# Patient Record
Sex: Male | Born: 1980
Health system: Southern US, Community
[De-identification: ages and names within clinical notes are randomized; demographics above are authoritative.]

## PROBLEM LIST (undated history)

## (undated) DIAGNOSIS — K222 Esophageal obstruction: Secondary | ICD-10-CM

## (undated) DIAGNOSIS — Z87442 Personal history of urinary calculi: Secondary | ICD-10-CM

## (undated) DIAGNOSIS — K529 Noninfective gastroenteritis and colitis, unspecified: Secondary | ICD-10-CM

## (undated) DIAGNOSIS — K219 Gastro-esophageal reflux disease without esophagitis: Secondary | ICD-10-CM

## (undated) DIAGNOSIS — F17200 Nicotine dependence, unspecified, uncomplicated: Secondary | ICD-10-CM

## (undated) DIAGNOSIS — K2970 Gastritis, unspecified, without bleeding: Secondary | ICD-10-CM

## (undated) DIAGNOSIS — R609 Edema, unspecified: Secondary | ICD-10-CM

## (undated) DIAGNOSIS — Z8619 Personal history of other infectious and parasitic diseases: Secondary | ICD-10-CM

## (undated) DIAGNOSIS — M545 Low back pain: Secondary | ICD-10-CM

## (undated) DIAGNOSIS — E669 Obesity, unspecified: Secondary | ICD-10-CM

## (undated) DIAGNOSIS — R12 Heartburn: Secondary | ICD-10-CM

## (undated) HISTORY — DX: Gastro-esophageal reflux disease without esophagitis: K21.9

## (undated) HISTORY — DX: Heartburn: R12

## (undated) HISTORY — DX: Personal history of other infectious and parasitic diseases: Z86.19

## (undated) HISTORY — DX: Edema, unspecified: R60.9

## (undated) HISTORY — DX: Esophageal obstruction: K22.2

## (undated) HISTORY — DX: Obesity, unspecified: E66.9

## (undated) HISTORY — DX: Nicotine dependence, unspecified, uncomplicated: F17.200

## (undated) HISTORY — DX: Low back pain: M54.5

## (undated) HISTORY — DX: Personal history of urinary calculi: Z87.442

## (undated) HISTORY — DX: Gastritis, unspecified, without bleeding: K29.70

## (undated) HISTORY — PX: ELBOW FRACTURE SURGERY: SHX616

## (undated) HISTORY — PX: WISDOM TOOTH EXTRACTION: SHX21

## (undated) HISTORY — DX: Noninfective gastroenteritis and colitis, unspecified: K52.9

---
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
## 2012-07-02 ENCOUNTER — Ambulatory Visit (INDEPENDENT_AMBULATORY_CARE_PROVIDER_SITE_OTHER): Payer: 59 | Admitting: Family Medicine

## 2012-07-02 ENCOUNTER — Encounter: Payer: Self-pay | Admitting: Family Medicine

## 2012-07-02 VITALS — BP 122/82 | HR 82 | Temp 98.3°F | Ht 73.0 in | Wt 301.2 lb

## 2012-07-02 DIAGNOSIS — K219 Gastro-esophageal reflux disease without esophagitis: Secondary | ICD-10-CM

## 2012-07-02 DIAGNOSIS — F17200 Nicotine dependence, unspecified, uncomplicated: Secondary | ICD-10-CM

## 2012-07-02 DIAGNOSIS — Z23 Encounter for immunization: Secondary | ICD-10-CM

## 2012-07-02 DIAGNOSIS — E669 Obesity, unspecified: Secondary | ICD-10-CM

## 2012-07-02 LAB — LIPID PANEL
HDL: 38.7 mg/dL — ABNORMAL LOW (ref >39.00)
LDL Cholesterol: 139 mg/dL — ABNORMAL HIGH (ref 0–99)
Total CHOL/HDL Ratio: 5
Triglycerides: 104 mg/dL (ref 0.0–149.0)
VLDL: 20.8 mg/dL (ref 0.0–40.0)

## 2012-07-02 LAB — COMPREHENSIVE METABOLIC PANEL
ALT: 63 U/L — ABNORMAL HIGH (ref 0–53)
Alkaline Phosphatase: 51 U/L (ref 39–117)
CO2: 27 mEq/L (ref 19–32)
Creatinine, Ser: 0.8 mg/dL (ref 0.4–1.5)
GFR: 127.31 mL/min (ref >60.00)
Total Bilirubin: 0.4 mg/dL (ref 0.3–1.2)

## 2012-07-02 NOTE — Assessment & Plan Note (Signed)
Discussed importance of smoking cessation as well as different options available.  Encouraged smoking cessation.  Gauged progress on stages of change - motivated to make change.  Thinks may quit cold [place] as has done this in past.  Main concern if weight gain after quitting smoking.  See below.  Provided with quitline[place].com resource.

## 2012-07-02 NOTE — Assessment & Plan Note (Signed)
Body mass index is 39.75 kg/(m^2). Discussed weight as well as questionable safety of OTC supplements for weight loss. Encouraged focusing on healthy changes to affect weight change including regular exercise despite season change, and diet changes. Motivated for weight loss. rtc prn or 1-2 yrs for next CPE.

## 2012-07-02 NOTE — Assessment & Plan Note (Signed)
Stable on omeprazole 20mg  daily.  Will likely improve with weight loss.

## 2012-07-02 NOTE — Addendum Note (Signed)
Addended by: Josph Macho A on: 07/02/2012 11:01 AM   Modules accepted: Orders

## 2012-07-02 NOTE — Progress Notes (Signed)
Subjective:    Patient ID: Micheal Jacobs, male    DOB: 07/09/1980, 32 y.o.   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  Wife Art gallery manager - at home 1 mo then away 1 mo.  Has 13 mo, 31 yo, and 31 yo at home.  GERD - controlled on prilosec.  Smoking - 1/2 ppd for 15 yrs.  Quit in past and gained 120 lbs.  E cigs cause sinus infection.  Has never used NRT or chantix.  Interested in quitting.  Caffeine: none Lives with wife, 3 children.  2 dogs at home, 2 goldfish Mother is anesthesiologist.   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 Down 15 lbs in last 2 weeks Using lipo six - oxy elite pro.  Has caffeine and some thermogenic component, appetite suppressant.  Denies chest pain, palpitations, insomnia. Changing diet - cutting portions.  Vegetables daily.  Avoids fish 2/2 shellfish allergy.  Preventative: Flu shot - declines. Tetanus - unsure.  Tdap today.  Medications and allergies reviewed and updated in chart.  Past histories reviewed and updated if relevant as below. There is no problem list on file for this patient.  Past Medical History  Diagnosis Date  . History of chicken pox   . Colitis     at age 28 yo?  . GERD (gastroesophageal reflux disease)   . History of kidney stones   . Smoker    Past Surgical History  Procedure Laterality Date  . Tonsillectomy  1986  . Elbow fracture surgery  1990s  . Wisdom tooth extraction     History  Substance Use Topics  . Smoking status: Current Every Day Smoker -- 0.50 packs/day    Types: Cigarettes    Start date: 05/22/1998  . Smokeless tobacco: Never Used  . Alcohol Use: Yes     Comment: 2/monthly; summer-every weekend   Family History  Problem Relation Age of Onset  . Heart disease Father 64    CHF  . CAD Paternal  Grandfather 67    MI  . Diabetes Father   . Arthritis Father   . Hypertension Mother   . Cancer Neg Hx   . Stroke Neg Hx    No Known Allergies No current outpatient prescriptions on file prior to visit.   No current facility-administered medications on file prior to visit.     Review of Systems  Constitutional: Negative for fever, chills, activity change, appetite change, fatigue and unexpected weight change.  HENT: Negative for hearing loss and neck pain.   Eyes: Negative for visual disturbance.  Respiratory: Negative for cough, chest tightness, shortness of breath and wheezing.   Cardiovascular: Negative for chest pain, palpitations and leg swelling.  Gastrointestinal: Negative for nausea, vomiting, abdominal pain, diarrhea, constipation, blood in stool and abdominal distention.  Genitourinary: Negative for hematuria and difficulty urinating.  Musculoskeletal: Negative for myalgias and arthralgias.  Skin: Negative for rash.  Neurological: Negative for dizziness, seizures, syncope and headaches.  Hematological: Does not bruise/bleed easily.  Psychiatric/Behavioral: Negative for dysphoric mood. The patient is not nervous/anxious.        Objective:   Physical Exam  Nursing note and vitals reviewed. Constitutional: He is oriented to person, place, and time. He appears well-developed and well-nourished. No distress.  obese  HENT:  Head:  Normocephalic and atraumatic.  Right Ear: Hearing, tympanic membrane, external ear and ear canal normal.  Left Ear: Hearing, tympanic membrane, external ear and ear canal normal.  Nose: Nose normal.  Mouth/Throat: Oropharynx is clear and moist. No oropharyngeal exudate.  Eyes: Conjunctivae and EOM are normal. Pupils are equal, round, and reactive to light. No scleral icterus.  Neck: Normal range of motion. Neck supple.  Cardiovascular: Normal rate, regular rhythm, normal heart sounds and intact distal pulses.   No murmur heard. Pulses:       Radial pulses are 2+ on the right side, and 2+ on the left side.  Pulmonary/Chest: Effort normal and breath sounds normal. No respiratory distress. He has no wheezes. He has no rales.  Abdominal: Soft. Bowel sounds are normal. He exhibits no distension and no mass. There is no tenderness. There is no rebound and no guarding.  Musculoskeletal: Normal range of motion. He exhibits no edema.  Lymphadenopathy:    He has no cervical adenopathy.  Neurological: He is alert and oriented to person, place, and time.  CN grossly intact, station and gait intact  Skin: Skin is warm and dry. No rash noted.  Psychiatric: He has a normal mood and affect. His behavior is normal. Judgment and thought content normal.       Assessment & Plan:  Tdap today.

## 2012-07-02 NOTE — Patient Instructions (Addendum)
Good to meet you today. Return as needed or in 1-2 years for next physical. Blood work today. Work on [employer] and exercise. Work on quitting smoking Let me know if you would like assistance with smoking cessation or further discussion of weight. Look into quitline[place].com for further smoking cessation resources.

## 2012-07-03 ENCOUNTER — Encounter: Payer: Self-pay | Admitting: *Deleted

## 2012-07-03 ENCOUNTER — Encounter: Payer: Self-pay | Admitting: Family Medicine

## 2012-09-24 ENCOUNTER — Encounter: Payer: Self-pay | Admitting: Family Medicine

## 2012-09-24 ENCOUNTER — Ambulatory Visit (INDEPENDENT_AMBULATORY_CARE_PROVIDER_SITE_OTHER): Payer: 59 | Admitting: Family Medicine

## 2012-09-24 VITALS — BP 114/82 | HR 74 | Temp 98.2°F | Wt 304.8 lb

## 2012-09-24 DIAGNOSIS — R609 Edema, unspecified: Secondary | ICD-10-CM

## 2012-09-24 DIAGNOSIS — K760 Fatty (change of) liver, not elsewhere classified: Secondary | ICD-10-CM | POA: Insufficient documentation

## 2012-09-24 DIAGNOSIS — R7402 Elevation of levels of lactic acid dehydrogenase (LDH): Secondary | ICD-10-CM

## 2012-09-24 DIAGNOSIS — R600 Localized edema: Secondary | ICD-10-CM

## 2012-09-24 HISTORY — DX: Edema, unspecified: R60.9

## 2012-09-24 HISTORY — DX: Localized edema: R60.0

## 2012-09-24 LAB — COMPREHENSIVE METABOLIC PANEL
ALT: 50 U/L (ref 0–53)
CO2: 27 mEq/L (ref 19–32)
Calcium: 9.3 mg/dL (ref 8.4–10.5)
Chloride: 105 mEq/L (ref 96–112)
Creatinine, Ser: 0.9 mg/dL (ref 0.4–1.5)
GFR: 108.76 mL/min (ref >60.00)
Total Protein: 7.4 g/dL (ref 6.0–8.3)

## 2012-09-24 LAB — POCT URINALYSIS DIPSTICK
Bilirubin, UA: NEGATIVE
Blood, UA: NEGATIVE
Glucose, UA: NEGATIVE
Spec Grav, UA: 1.015

## 2012-09-24 LAB — IBC PANEL: Transferrin: 303.9 mg/dL (ref 212.0–360.0)

## 2012-09-24 NOTE — Assessment & Plan Note (Signed)
Down to 1 cig/day.  Congratulated.

## 2012-09-24 NOTE — Assessment & Plan Note (Addendum)
3 separate episodes of bilaterally equal dependent edema that happened after prolonged immobility - check UA for proteinuria as well as Cr. Today no edema present. Discussed anticipated cause - possible early CVI, but likely more due to morbid obesity. Encouraged breaks during long car rides, elevation of legs, avoiding sodium in diet. Update me if persistent sxs.  Body mass index is 40.22 kg/(m^2).

## 2012-09-24 NOTE — Patient Instructions (Signed)
Blood work today to check on foot swelling and liver. I think this is dependent edema from prolonged immobility. Take breaks during long car rides, elevate legs as needed. Avoid high salt diet.  Ensure good water intake. Work on [employer] - as weight loss will likely help the most with this leg swelling.

## 2012-09-24 NOTE — Progress Notes (Signed)
  Subjective:    Patient ID: Micheal Jacobs, male    DOB: 12/31/1980, 31 y.o.   MRN: [id number]  HPI CC: foot swelling  3 separate epsidoes of bilateral leg swelling over last 3 weeks, has happened after prolonged immobility.  Denies pain in legs/feet.  Denies fevers/chills, erythema, warmth, dyspnea, or chest pain/tightness.  No hand, face swelling.  Strong fmhx CAD.  Doesn't add salt to diet.  Eats [place], hamburger, steak. Mediocre diet - good water daily. Allergic to shellfish.  Exercise - cuts grass.  Smoking - down to 1 cig/day  GERD - prilosec controls sxs.  Has increased tums.  Wt Readings from Last 3 Encounters:  09/24/12 304 lb 12 oz (138.234 kg)  07/02/12 301 lb 4 oz (136.646 kg)    Past Medical History  Diagnosis Date  . Colitis 2000s    ?diverticulitis at age 50 yo  . GERD (gastroesophageal reflux disease)   . Smoker   . Obesity   . History of kidney stones   . History of chicken pox     Family History  Problem Relation Age of Onset  . Heart disease Father 51    CHF  . CAD Paternal Grandfather 75    MI  . Diabetes Father   . Arthritis Father   . Hypertension Mother   . Cancer Neg Hx   . Stroke Neg Hx     Review of Systems Per HPI    Objective:   Physical Exam  Nursing note and vitals reviewed. Constitutional: He appears well-developed and well-nourished. No distress.  Morbidly obese  HENT:  Head: Normocephalic and atraumatic.  Mouth/Throat: Oropharynx is clear and moist. No oropharyngeal exudate.  Eyes: Conjunctivae and EOM are normal. Pupils are equal, round, and reactive to light. No scleral icterus.  Neck: Normal range of motion. Neck supple. No thyromegaly present.  Cardiovascular: Normal rate, regular rhythm, normal heart sounds and intact distal pulses.   No murmur heard. Pulses:      Dorsalis pedis pulses are 2+ on the right side, and 2+ on the left side.       Posterior tibial pulses are 2+ on the right side, and 2+ on the  left side.  Pulmonary/Chest: Effort normal and breath sounds normal. No respiratory distress. He has no wheezes. He has no rales.  Abdominal: Soft. Normal appearance and bowel sounds are normal. He exhibits no distension and no mass. There is no hepatosplenomegaly. There is no tenderness. There is no rigidity, no rebound, no guarding, no CVA tenderness and negative Murphy's sign.  Musculoskeletal: He exhibits no edema.  No edema present today FROM at ankles and feet. No palpable cords  Skin: Skin is warm and dry. No rash noted.  Psychiatric: He has a normal mood and affect.       Assessment & Plan:

## 2012-09-24 NOTE — Assessment & Plan Note (Signed)
recheck today along with iron levels and hep panel. Consider abd [place] to eval fatty liver (suspected cause given weight).

## 2012-09-25 ENCOUNTER — Encounter: Payer: Self-pay | Admitting: *Deleted

## 2014-01-13 ENCOUNTER — Telehealth: Payer: Self-pay

## 2014-01-13 ENCOUNTER — Ambulatory Visit: Payer: Self-pay | Admitting: Family Medicine

## 2014-01-13 ENCOUNTER — Encounter (INDEPENDENT_AMBULATORY_CARE_PROVIDER_SITE_OTHER): Payer: Self-pay

## 2014-01-13 ENCOUNTER — Ambulatory Visit (INDEPENDENT_AMBULATORY_CARE_PROVIDER_SITE_OTHER): Payer: 59 | Admitting: Family Medicine

## 2014-01-13 ENCOUNTER — Encounter: Payer: Self-pay | Admitting: Family Medicine

## 2014-01-13 VITALS — BP 110/80 | HR 84 | Temp 98.2°F | Wt 293.5 lb

## 2014-01-13 DIAGNOSIS — R109 Unspecified abdominal pain: Secondary | ICD-10-CM

## 2014-01-13 DIAGNOSIS — H538 Other visual disturbances: Secondary | ICD-10-CM

## 2014-01-13 DIAGNOSIS — R21 Rash and other nonspecific skin eruption: Secondary | ICD-10-CM | POA: Insufficient documentation

## 2014-01-13 DIAGNOSIS — R51 Headache: Secondary | ICD-10-CM | POA: Insufficient documentation

## 2014-01-13 DIAGNOSIS — R519 Headache, unspecified: Secondary | ICD-10-CM | POA: Insufficient documentation

## 2014-01-13 LAB — POCT URINALYSIS DIPSTICK
Bilirubin, UA: NEGATIVE
Glucose, UA: NEGATIVE
Ketones, UA: NEGATIVE
LEUKOCYTES UA: NEGATIVE
NITRITE UA: NEGATIVE
PH UA: 6
PROTEIN UA: 15
RBC UA: NEGATIVE
Spec Grav, UA: 1.025
UROBILINOGEN UA: 0.2

## 2014-01-13 LAB — BASIC METABOLIC PANEL
BUN: 13 mg/dL (ref 6–23)
CO2: 26 mEq/L (ref 19–32)
CREATININE: 1 mg/dL (ref 0.4–1.5)
Calcium: 10.6 mg/dL — ABNORMAL HIGH (ref 8.4–10.5)
Chloride: 102 mEq/L (ref 96–112)
GFR: 97.44 mL/min (ref >60.00)
Glucose, Bld: 69 mg/dL — ABNORMAL LOW (ref 70–99)
Potassium: 4.5 mEq/L (ref 3.5–5.1)
SODIUM: 138 meq/L (ref 135–145)

## 2014-01-13 LAB — TSH: TSH: 2.56 u[IU]/mL (ref 0.35–4.50)

## 2014-01-13 MED ORDER — TRIAMCINOLONE ACETONIDE 0.1 % EX CREA: 1.0000 "application " | TOPICAL_CREAM | Freq: Two times a day (BID) | CUTANEOUS | Status: DC

## 2014-01-13 NOTE — Patient Instructions (Signed)
I recommend head imaging to check for cause of persistent headache. If normal, will recommend you see eye doctor for vision check. Blood work today. For back - sounds musculoskeletal. Urine looked ok today.

## 2014-01-13 NOTE — Telephone Encounter (Signed)
Micheal Jacobs with [facility] called CT of head report and faxed report to Dr Reece Agar. Dr Reece Agar notified normal head CT; pt went home. Faxed report has come and in Dr Timoteo Expose in box.

## 2014-01-13 NOTE — Progress Notes (Signed)
BP 110/80  Pulse 84  Temp(Src) 98.2 F (36.8 C) (Oral)  Wt 293 lb 8 oz (133.131 kg)   CC: multiple issues today  Subjective:    Patient ID: Micheal Jacobs, male    DOB: 1980-11-02, 33 y.o.   MRN: [id number]  HPI: Micheal Jacobs is a 33 y.o. male presenting on 01/13/2014 for Flank Pain, Headache and Rash   2 mo h/o headache associated with blurry vision. Headache bilateral temple. Throbbing pain persistent. Changes in intensity. Has tried zyrtec for this as well as aleve and sinus medication. Excedrin migraine did not help either. Not taking meds regularly. No neck pain. No fevers/chills, nausea. +heat intolerance. + photo and phonophobia. No aura. No h/o migraine. Constant headache. Some dizziness endorsed as well.  Did hit head April 25th on roll cage.  Recently had tooth cavity repaired - dentist said not related.   2 mo h/o itchy rash at night time on bilateral lateral upper arms, some burning pain as well.  Has tried moisturizing cream, has tried otc antifungal. No recent changes to lotions, detergents, soaps or shampoos.  Endorses episodes of sharp stabbing pain at R flank. Worse when he drinks plenty of fluids. H/o kidney stones in past. No dysuria, urgency, frequency, no nausea or fevers.   Past Medical History  Diagnosis Date  . Colitis 2000s    ?diverticulitis at age 89 yo  . GERD (gastroesophageal reflux disease)   . Smoker   . Obesity   . History of kidney stones   . History of chicken pox     Past Surgical History  Procedure Laterality Date  . Tonsillectomy  1986  . Elbow fracture surgery  1990s  . Wisdom tooth extraction      Relevant past medical, surgical, family and social history reviewed and updated as indicated.  Allergies and medications reviewed and updated. Current Outpatient Prescriptions on File Prior to Visit  Medication Sig  . omeprazole (PRILOSEC OTC) 20 MG tablet Take 20 mg by mouth daily.   No current facility-administered medications  on file prior to visit.    Review of Systems Per HPI unless specifically indicated above    Objective:    BP 110/80  Pulse 84  Temp(Src) 98.2 F (36.8 C) (Oral)  Wt 293 lb 8 oz (133.131 kg)  Physical Exam  Nursing note and vitals reviewed. Constitutional: He is oriented to person, place, and time. He appears well-developed and well-nourished. No distress.  HENT:  Mouth/Throat: Oropharynx is clear and moist. No oropharyngeal exudate.  Eyes: Conjunctivae and EOM are normal. Pupils are equal, round, and reactive to light. No scleral icterus.  Could not appreciate papilledema on limited fundoscopic exam today  Neck: Normal range of motion. Neck supple.  Cardiovascular: Normal rate, regular rhythm, normal heart sounds and intact distal pulses.   No murmur heard. Pulmonary/Chest: Effort normal and breath sounds normal. No respiratory distress. He has no wheezes. He has no rales.  Musculoskeletal: He exhibits no edema.  Tender to palpation at right lower lateral back superior to iliac crest No midline spine tenderness  Lymphadenopathy:    He has no cervical adenopathy.  Neurological: He is alert and oriented to person, place, and time. He has normal strength. No cranial nerve deficit or sensory deficit. He displays a negative Romberg sign.  CN 2-12 intact FTN intact  Skin: Skin is warm and dry. Rash noted.  Excoriated papules bilateral upper lateral arms and scarred papules posterior neck  Psychiatric: He has a normal  mood and affect.   Results for orders placed in visit on 01/13/14  POCT URINALYSIS DIPSTICK      Result Value Ref Range   Color, UA dark yellow     Clarity, UA clear     Glucose, UA negative     Bilirubin, UA negative     Ketones, UA negative     Spec Grav, UA 1.025     Blood, UA negative     pH, UA 6.0     Protein, UA 15     Urobilinogen, UA 0.2     Nitrite, UA negative     Leukocytes, UA Negative        Assessment & Plan:   Problem List Items Addressed  This Visit   Headache(784.0) - Primary     With endorsed blurry vision after head injury sustained 08/2013. Snellen normal today. nonfocal neurological exam today, however given persistent headaches after head injury will check head CT w/o contrast to r/o subdural as cause of headache. If unrevealing, recommended eval by eye doctor. Pt agrees with plan. If persistently normal workup, may consider neurology referral for headache.    Relevant Medications      naproxen sodium (ANAPROX) 220 MG tablet   Other Relevant Orders      TSH      Basic metabolic panel      CT Head Wo Contrast   Right flank pain     UA normal. Sounds MSK. Offered muscle relaxant, pt declines.    Relevant Orders      POCT Urinalysis Dipstick (Completed)   Skin rash     Unclear etiology, not consistent with scabies/mites or bug biges (only on limited area of body). Ongoing for last 2 months. No new meds or foods either. ?irritant dermatitis vs folliculitis. Will treat with mid potency steroid cream.     Other Visit Diagnoses   Blurry vision        Relevant Orders       CT Head Wo Contrast        Follow up plan: Return if symptoms worsen or fail to improve.

## 2014-01-13 NOTE — Assessment & Plan Note (Addendum)
With endorsed blurry vision after head injury sustained 08/2013. Snellen normal today. nonfocal neurological exam today, however given persistent headaches after head injury will check head CT w/o contrast to r/o subdural as cause of headache. If unrevealing, recommended eval by eye doctor. Pt agrees with plan. If persistently normal workup, may consider neurology referral for headache.

## 2014-01-13 NOTE — Progress Notes (Signed)
Pre visit review using our clinic review tool, if applicable. No additional management support is needed unless otherwise documented below in the visit note. 

## 2014-01-13 NOTE — Telephone Encounter (Signed)
called pt - discussed normal head CT. Recommended he get vision checked and to update me with results.

## 2014-01-13 NOTE — Assessment & Plan Note (Signed)
UA normal. Sounds MSK. Offered muscle relaxant, pt declines.

## 2014-01-13 NOTE — Assessment & Plan Note (Addendum)
Unclear etiology, not consistent with scabies/mites or bug biges (only on limited area of body). Ongoing for last 2 months. No new meds or foods either. ?irritant dermatitis vs folliculitis. Will treat with mid potency steroid cream.

## 2014-01-22 ENCOUNTER — Encounter: Payer: Self-pay | Admitting: Family Medicine

## 2014-02-04 ENCOUNTER — Telehealth: Payer: Self-pay | Admitting: Family Medicine

## 2014-02-04 NOTE — Telephone Encounter (Signed)
Patient Information:  Caller Name: Tien  Phone: [phone]  Patient: Micheal Jacobs  Gender: Male  DOB: [birth year]  Age: 33 Years  PCP: Eustaquio [facility])  Office Follow Up:  Does the office need to follow up with this patient?: No  Instructions For The Office: N/A   Symptoms  Reason For Call & Symptoms: Patient reports he feels that he has sinus infection.  Onset symptoms 01/29/14.  Sinus pain rated at 6-7 of 10.  Reviewed Health History In EMR: Yes  Reviewed Medications In EMR: Yes  Reviewed Allergies In EMR: Yes  Reviewed Surgeries / Procedures: Yes  Date of Onset of Symptoms: 01/29/2014  Treatments Tried: Dayquil, Nyquil, Mucinex, Neti Pot, Herbalist with Honey  Treatments Tried Worked: No  Guideline(s) Used:  Sinus Pain and Congestion  Disposition Per Guideline:   See Today or Tomorrow in Office  Reason For Disposition Reached:   Using nasal washes and pain medicine > 24 hours and sinus pain (lower forehead, cheekbone, or eye) persists  Advice Given:  For a Stuffy Nose - Use Nasal Washes:  How it Helps: The salt water rinses out excess mucus, washes out any irritants (dust, allergens) that might be present, and moistens the nasal cavity.  Pain and Fever Medicines:  For pain or fever relief, take either acetaminophen or ibuprofen.  Hydration:  Drink plenty of liquids (6-8 glasses of water daily). If the air in your home is dry, use a cool mist humidifier  Call Back If:   You become worse.  Patient Refused Recommendation:  Patient Refused Appt, Patient Requests Appt At Later Date  Caller declined appointment at [facility] office for 14:15; states he is traveling and needs sooner appointment.  He declined to make appointment at another location for this date.  Attempted to transfer him to office for assistance to schedule for 02/09/14 per his request.

## 2014-02-04 NOTE — Telephone Encounter (Signed)
I'm unclear - was appt scheduled? No appt scheduled in chart. Or can we call to schedule?

## 2014-02-05 NOTE — Telephone Encounter (Signed)
Pt declined to schedule appt., called pt to check on him and he said he went to an UC since he is out of town

## 2014-02-12 ENCOUNTER — Other Ambulatory Visit: Payer: Self-pay | Admitting: Family Medicine

## 2014-02-12 DIAGNOSIS — R51 Headache: Secondary | ICD-10-CM

## 2014-02-20 ENCOUNTER — Other Ambulatory Visit (INDEPENDENT_AMBULATORY_CARE_PROVIDER_SITE_OTHER): Payer: 59

## 2014-02-20 ENCOUNTER — Other Ambulatory Visit: Payer: 59

## 2014-02-20 DIAGNOSIS — R519 Headache, unspecified: Secondary | ICD-10-CM

## 2014-02-20 DIAGNOSIS — R51 Headache: Secondary | ICD-10-CM

## 2014-02-20 LAB — BASIC METABOLIC PANEL
BUN: 13 mg/dL (ref 6–23)
CHLORIDE: 104 meq/L (ref 96–112)
CO2: 28 mEq/L (ref 19–32)
CREATININE: 0.8 mg/dL (ref 0.4–1.5)
Calcium: 9.6 mg/dL (ref 8.4–10.5)
GFR: 113.8 mL/min (ref >60.00)
Glucose, Bld: 87 mg/dL (ref 70–99)
Potassium: 4.4 mEq/L (ref 3.5–5.1)
Sodium: 138 mEq/L (ref 135–145)

## 2014-02-23 ENCOUNTER — Encounter: Payer: Self-pay | Admitting: *Deleted

## 2015-03-30 ENCOUNTER — Encounter: Payer: Self-pay | Admitting: Family Medicine

## 2015-03-30 ENCOUNTER — Encounter: Payer: Self-pay | Admitting: *Deleted

## 2015-03-30 ENCOUNTER — Ambulatory Visit (INDEPENDENT_AMBULATORY_CARE_PROVIDER_SITE_OTHER): Payer: 59 | Admitting: Family Medicine

## 2015-03-30 VITALS — BP 110/76 | HR 83 | Temp 97.8°F | Wt 259.2 lb

## 2015-03-30 DIAGNOSIS — J111 Influenza due to unidentified influenza virus with other respiratory manifestations: Secondary | ICD-10-CM | POA: Diagnosis not present

## 2015-03-30 DIAGNOSIS — R69 Illness, unspecified: Principal | ICD-10-CM

## 2015-03-30 LAB — POCT INFLUENZA A/B
INFLUENZA A, POC: NEGATIVE
INFLUENZA B, POC: NEGATIVE

## 2015-03-30 NOTE — Assessment & Plan Note (Signed)
Flu swab today negative Anticipate flu like illness Treat supportively with plenty of fluids and rest. Out of work for next 2 days. Update if fever, productive cough, or other concerning sxs develop. Pt agrees with plan.

## 2015-03-30 NOTE — Progress Notes (Signed)
BP 110/76 mmHg  Pulse 83  Temp(Src) 97.8 F (36.6 C) (Oral)  Wt 259 lb 4 oz (117.595 kg)  SpO2 96%   CC: flu like symptoms  Subjective:    Patient ID: Micheal Jacobs, male    DOB: Apr 28, 1981, 34 y.o.   MRArnetha Courser: [id number]  HPI: Micheal Jacobs is a 34 y.o. male presenting on 03/30/2015 for Chills   1 wk h/o malaise, fever, chills, body aches, + PNDrainage and ST. Cough productive in am.   No ear or tooth pain, headache, new rashes, abd pain, dyspnea or wheezing. Appetite poor  Taking dayquil and nyquil.  Stayed home from work today.  Wife with walking PNA, daughter with possible HFM disease. No strep throat exposure.  Smoking - decreasing.  No h/o asthma.  Has not received flu shot this year.  Relevant past medical, surgical, family and social history reviewed and updated as indicated. Interim medical history since our last visit reviewed. Allergies and medications reviewed and updated. Current Outpatient Prescriptions on File Prior to Visit  Medication Sig  . naproxen sodium (ANAPROX) 220 MG tablet Take 660 mg by mouth daily.  Marland Kitchen. omeprazole (PRILOSEC OTC) 20 MG tablet Take 20 mg by mouth daily.  Marland Kitchen. triamcinolone cream (KENALOG) 0.1 % Apply 1 application topically 2 (two) times daily. Apply to AA. (Patient not taking: Reported on 03/30/2015)   No current facility-administered medications on file prior to visit.    Review of Systems Per HPI unless specifically indicated in ROS section     Objective:    BP 110/76 mmHg  Pulse 83  Temp(Src) 97.8 F (36.6 C) (Oral)  Wt 259 lb 4 oz (117.595 kg)  SpO2 96%  Wt Readings from Last 3 Encounters:  03/30/15 259 lb 4 oz (117.595 kg)  01/13/14 293 lb 8 oz (133.131 kg)  09/24/12 304 lb 12 oz (138.234 kg)    Physical Exam  Constitutional: He appears well-developed and well-nourished. No distress.  HENT:  Head: Normocephalic and atraumatic.  Right Ear: Hearing, tympanic membrane, external ear and ear canal normal.  Left Ear:  Hearing, tympanic membrane, external ear and ear canal normal.  Nose: Mucosal edema (erythema) present. No rhinorrhea. Right sinus exhibits no maxillary sinus tenderness and no frontal sinus tenderness. Left sinus exhibits no maxillary sinus tenderness and no frontal sinus tenderness.  Mouth/Throat: Uvula is midline and mucous membranes are normal. Posterior oropharyngeal erythema present. No oropharyngeal exudate, posterior oropharyngeal edema or tonsillar abscesses.  Eyes: Conjunctivae and EOM are normal. Pupils are equal, round, and reactive to light. No scleral icterus.  Neck: Normal range of motion. Neck supple.  Cardiovascular: Normal rate, regular rhythm, normal heart sounds and intact distal pulses.   No murmur heard. Pulmonary/Chest: Effort normal and breath sounds normal. No respiratory distress. He has no wheezes. He has no rales.  Lymphadenopathy:    He has cervical adenopathy (mild AC LAD).  Skin: Skin is warm and dry. No rash noted.  Nursing note and vitals reviewed.     Assessment & Plan:   Problem List Items Addressed This Visit    Influenza-like illness - Primary    Flu swab today negative Anticipate flu like illness Treat supportively with plenty of fluids and rest. Out of work for next 2 days. Update if fever, productive cough, or other concerning sxs develop. Pt agrees with plan.      Relevant Orders   POCT Influenza A/B (Completed)       Follow up plan: Return if symptoms worsen  or fail to improve.

## 2015-03-30 NOTE — Patient Instructions (Addendum)
I think you have a flu like illness - treat with ibuprofen 400-600mg  twice daily with food over next 2-3 days, lots of fluids and rest.  Out of work for next 2 days.  Watch for fever >101, worsening productive cough, or just not improving as expected.

## 2015-03-30 NOTE — Progress Notes (Signed)
Pre visit review using our clinic review tool, if applicable. No additional management support is needed unless otherwise documented below in the visit note. 

## 2015-10-15 ENCOUNTER — Emergency Department
Admission: EM | Admit: 2015-10-15 | Discharge: 2015-10-15 | Disposition: A | Discharge status: Home / Self Care | Payer: 59 | Attending: Emergency Medicine | Admitting: Emergency Medicine

## 2015-10-15 ENCOUNTER — Emergency Department: Payer: 59

## 2015-10-15 DIAGNOSIS — F17210 Nicotine dependence, cigarettes, uncomplicated: Secondary | ICD-10-CM | POA: Insufficient documentation

## 2015-10-15 DIAGNOSIS — Z791 Long term (current) use of non-steroidal anti-inflammatories (NSAID): Secondary | ICD-10-CM | POA: Diagnosis not present

## 2015-10-15 DIAGNOSIS — Z79899 Other long term (current) drug therapy: Secondary | ICD-10-CM | POA: Diagnosis not present

## 2015-10-15 DIAGNOSIS — R079 Chest pain, unspecified: Secondary | ICD-10-CM | POA: Insufficient documentation

## 2015-10-15 LAB — BASIC METABOLIC PANEL
Anion gap: 7 (ref 5–15)
BUN: 13 mg/dL (ref 6–20)
CALCIUM: 10.7 mg/dL — AB (ref 8.9–10.3)
CO2: 30 mmol/L (ref 22–32)
CREATININE: 0.99 mg/dL (ref 0.61–1.24)
Chloride: 101 mmol/L (ref 101–111)
GFR calc Af Amer: >60 mL/min (ref >60)
GFR calc non Af Amer: >60 mL/min (ref >60)
GLUCOSE: 86 mg/dL (ref 65–99)
Potassium: 3.8 mmol/L (ref 3.5–5.1)
Sodium: 138 mmol/L (ref 135–145)

## 2015-10-15 LAB — CBC
HCT: 47.4 % (ref 40.0–52.0)
HEMOGLOBIN: 16.4 g/dL (ref 13.0–18.0)
MCH: 29.7 pg (ref 26.0–34.0)
MCHC: 34.6 g/dL (ref 32.0–36.0)
MCV: 85.9 fL (ref 80.0–100.0)
PLATELETS: 183 10*3/uL (ref 150–440)
RBC: 5.52 MIL/uL (ref 4.40–5.90)
RDW: 13.7 % (ref 11.5–14.5)
WBC: 10.9 10*3/uL — ABNORMAL HIGH (ref 3.8–10.6)

## 2015-10-15 LAB — TROPONIN I: Troponin I: <0.03 ng/mL (ref <0.031)

## 2015-10-15 LAB — FIBRIN DERIVATIVES D-DIMER (ARMC ONLY): Fibrin derivatives D-dimer (ARMC): 198 (ref 0–499)

## 2015-10-15 MED ORDER — LORAZEPAM 1 MG PO TABS
1.0000 mg | ORAL_TABLET | Freq: Once | ORAL | Status: AC
  Administered 2015-10-15: 1 mg via ORAL
  Filled 2015-10-15: qty 1

## 2015-10-15 MED ORDER — PANTOPRAZOLE SODIUM 40 MG PO TBEC: 40.0000 mg | DELAYED_RELEASE_TABLET | Freq: Every day | ORAL | Status: DC

## 2015-10-15 MED ORDER — NITROGLYCERIN 0.4 MG SL SUBL
0.4000 mg | SUBLINGUAL_TABLET | Freq: Once | SUBLINGUAL | Status: AC
  Administered 2015-10-15: 0.4 mg via SUBLINGUAL

## 2015-10-15 MED ORDER — ASPIRIN 81 MG PO CHEW
324.0000 mg | CHEWABLE_TABLET | Freq: Once | ORAL | Status: AC
  Administered 2015-10-15: 324 mg via ORAL

## 2015-10-15 MED ORDER — ASPIRIN 81 MG PO CHEW
CHEWABLE_TABLET | ORAL | Status: AC
  Administered 2015-10-15: 324 mg via ORAL
  Filled 2015-10-15: qty 4

## 2015-10-15 MED ORDER — NITROGLYCERIN 0.4 MG SL SUBL
SUBLINGUAL_TABLET | SUBLINGUAL | Status: AC
  Administered 2015-10-15: 0.4 mg via SUBLINGUAL
  Filled 2015-10-15: qty 1

## 2015-10-15 MED ORDER — PANTOPRAZOLE SODIUM 40 MG PO TBEC: 40.0000 mg | DELAYED_RELEASE_TABLET | Freq: Once | ORAL | Status: DC

## 2015-10-15 MED ORDER — PANTOPRAZOLE SODIUM 40 MG PO TBEC
40.0000 mg | DELAYED_RELEASE_TABLET | Freq: Once | ORAL | Status: AC
  Administered 2015-10-15: 40 mg via ORAL
  Filled 2015-10-15: qty 1

## 2015-10-15 NOTE — ED Provider Notes (Signed)
[facility] Emergency Department Provider Note  ____________________________________________  Time seen: 12:45AM  I have reviewed the triage vital signs and the nursing notes.   HISTORY  Chief Complaint Chest Pain      HPI Micheal Jacobs is a 35 y.o. male with history of panic attacks and esophageal spasm presents with acute onset of left side chest pain on awakening this morning with radiation into his left arm and back area patient also admits to "a nagging cough that is new". Patient also admits to being diaphoretic and nauseated. Patient denies any lower extremity pain or swelling no history DVT. No cardiac history.     Past Medical History  Diagnosis Date  . Colitis 2000s    ?diverticulitis at age 35 yo  . GERD (gastroesophageal reflux disease)   . Smoker   . Obesity   . History of kidney stones   . History of chicken pox     Patient Active Problem List   Diagnosis Date Noted  . Influenza-like illness 03/30/2015  . Headache(784.0) 01/13/2014  . Right flank pain 01/13/2014  . Skin rash 01/13/2014  . Edema, peripheral 09/24/2012  . Transaminitis 09/24/2012  . GERD (gastroesophageal reflux disease)   . Smoker   . Obesity     Past Surgical History  Procedure Laterality Date  . Tonsillectomy  1986  . Elbow fracture surgery  1990s  . Wisdom tooth extraction      Current Outpatient Rx  Name  Route  Sig  Dispense  Refill  . naproxen sodium (ANAPROX) 220 MG tablet   Oral   Take 660 mg by mouth daily.         Marland Kitchen omeprazole (PRILOSEC OTC) 20 MG tablet   Oral   Take 20 mg by mouth daily.         Marland Kitchen triamcinolone cream (KENALOG) 0.1 %   Topical   Apply 1 application topically 2 (two) times daily. Apply to AA. Patient not taking: Reported on 03/30/2015   45 g   0     Allergies Shellfish allergy  Family History  Problem Relation Age of Onset  . Heart disease Father 23    CHF  . CAD Paternal Grandfather 12    MI  .  Diabetes Father   . Arthritis Father   . Hypertension Mother   . Cancer Neg Hx   . Stroke Neg Hx   . Thyroid disease Neg Hx     Social History Social History  Substance Use Topics  . Smoking status: Current Every Day Smoker -- 0.10 packs/day    Types: Cigarettes    Start date: 05/22/1998  . Smokeless tobacco: Never Used     Comment: 2-3 cigarettes/day  . Alcohol Use: Yes     Comment: 2/monthly    Review of Systems  Constitutional: Negative for fever. Eyes: Negative for visual changes. ENT: Negative for sore throat. Cardiovascular: Positive for chest pain. Respiratory: Negative for shortness of breath. Gastrointestinal: Negative for abdominal pain, vomiting and diarrhea. Positive for nausea Genitourinary: Negative for dysuria. Musculoskeletal: Negative for back pain. Skin: Negative for rash. Neurological: Negative for headaches, focal weakness or numbness.  10-point ROS otherwise negative.  ____________________________________________   PHYSICAL EXAM:  VITAL SIGNS: ED Triage Vitals  Enc Vitals Group     BP 10/15/15 0031 131/88 mmHg     Pulse Rate 10/15/15 0031 93     Resp 10/15/15 0031 20     Temp --      Temp  src --      SpO2 10/15/15 0031 96 %     Weight 10/15/15 0031 265 lb (120.203 kg)     Height 10/15/15 0031 6\' 1"  (1.854 m)     Head Cir --      Peak Flow --      Pain Score 10/15/15 0033 3     Pain Loc --      Pain Edu? --      Excl. in GC? --     Constitutional: Alert and oriented. Well appearing and in no distress. Eyes: Conjunctivae are normal. PERRL. Normal extraocular movements. ENT   Head: Normocephalic and atraumatic.   Nose: No congestion/rhinnorhea.   Mouth/Throat: Mucous membranes are moist.   Neck: No stridor. Hematological/Lymphatic/Immunilogical: No cervical lymphadenopathy. Cardiovascular: Normal rate, regular rhythm. Normal and symmetric distal pulses are present in all extremities. No murmurs, rubs, or  gallops. Respiratory: Normal respiratory effort without tachypnea nor retractions. Breath sounds are clear and equal bilaterally. No wheezes/rales/rhonchi. Gastrointestinal: Soft and nontender. No distention. There is no CVA tenderness. Genitourinary: deferred Musculoskeletal: Nontender with normal range of motion in all extremities. No joint effusions.  No lower extremity tenderness nor edema. Neurologic:  Normal speech and language. No gross focal neurologic deficits are appreciated. Speech is normal.  Skin:  Skin is warm, dry and intact. No rash noted. Psychiatric: Mood and affect are normal. Speech and behavior are normal. Patient exhibits appropriate insight and judgment.  ____________________________________________    LABS (pertinent positives/negatives)  Labs Reviewed  BASIC METABOLIC PANEL - Abnormal; Notable for the following:    Calcium 10.7 (*)    All other components within normal limits  CBC - Abnormal; Notable for the following:    WBC 10.9 (*)    All other components within normal limits  TROPONIN I  FIBRIN DERIVATIVES D-DIMER ([facility] ONLY)  TROPONIN I    ____________________________________________   EKG  ED ECG REPORT I, [place] Annalis Kaczmarczyk, the attending physician, personally viewed and interpreted this ECG.   Date: 10/15/2015  EKG Time: 12:26 AM  Rate: 88  Rhythm: Normal sinus rhythm  Axis: Normal  Intervals: Normal  ST&T Change: None   ____________________________________________    RADIOLOGY   DG Chest 2 View (Final result) Result time: 10/15/15 01:11:18   [facility]      Final result by Rad Results In Interface (10/15/15 01:11:18)   Narrative:   CLINICAL DATA: Acute onset of left-sided chest pain and shortness of breath. Cough. Initial encounter.  EXAM: CHEST 2 VIEW  COMPARISON: None.  FINDINGS: The lungs are well-aerated. Minimal bilateral atelectasis is noted. There is no evidence of pleural  effusion or pneumothorax.  The heart is normal in size; the mediastinal contour is within normal limits. No acute osseous abnormalities are seen.  IMPRESSION: Minimal bilateral atelectasis noted. Lungs otherwise clear.   Electronically Signed By: Roanna RaiderJeffery Chang M.D. On: 10/15/2015 01:11     INITIAL IMPRESSION / ASSESSMENT AND PLAN / ED COURSE  Pertinent labs & imaging results that were available during my care of the patient were reviewed by me and considered in my medical decision making (see chart for details).  EKG revealed no evidence of myocardial infarction, troponin negative 2. Consider possibility of PE however d-dimer negative PERC 0. Pain resolved at this time  ____________________________________________   FINAL CLINICAL IMPRESSION(S) / ED DIAGNOSES  Final diagnoses:  Chest pain      Darci Currentandolph N Lillyauna Jenkinson, MD 10/15/15 [phone]

## 2015-10-15 NOTE — Discharge Instructions (Signed)
Nonspecific Chest Pain  °Chest pain can be caused by many different conditions. There is always a chance that your pain could be related to something serious, such as a heart attack or a blood clot in your lungs. Chest pain can also be caused by conditions that are not life-threatening. If you have chest pain, it is very important to follow up with your health care provider. °CAUSES  °Chest pain can be caused by: °· Heartburn. °· Pneumonia or bronchitis. °· Anxiety or stress. °· Inflammation around your heart (pericarditis) or lung (pleuritis or pleurisy). °· A blood clot in your lung. °· A collapsed lung (pneumothorax). It can develop suddenly on its own (spontaneous pneumothorax) or from trauma to the chest. °· Shingles infection (varicella-zoster virus). °· Heart attack. °· Damage to the bones, muscles, and cartilage that make up your chest wall. This can include: °¨ Bruised bones due to injury. °¨ Strained muscles or cartilage due to frequent or repeated coughing or overwork. °¨ Fracture to one or more ribs. °¨ Sore cartilage due to inflammation (costochondritis). °RISK FACTORS  °Risk factors for chest pain may include: °· Activities that increase your risk for trauma or injury to your chest. °· Respiratory infections or conditions that cause frequent coughing. °· Medical conditions or overeating that can cause heartburn. °· Heart disease or family history of heart disease. °· Conditions or health behaviors that increase your risk of developing a blood clot. °· Having had chicken pox (varicella zoster). °SIGNS AND SYMPTOMS °Chest pain can feel like: °· Burning or tingling on the surface of your chest or deep in your chest. °· Crushing, pressure, aching, or squeezing pain. °· Dull or sharp pain that is worse when you move, cough, or take a deep breath. °· Pain that is also felt in your back, neck, shoulder, or arm, or pain that spreads to any of these areas. °Your chest pain may come and go, or it may stay  constant. °DIAGNOSIS °Lab tests or other studies may be needed to find the cause of your pain. Your health care provider may have you take a test called an ambulatory ECG (electrocardiogram). An ECG records your heartbeat patterns at the time the test is performed. You may also have other tests, such as: °· Transthoracic echocardiogram (TTE). During echocardiography, sound waves are used to create a picture of all of the heart structures and to look at how blood flows through your heart. °· Transesophageal echocardiogram (TEE). This is a more advanced imaging test that obtains images from inside your body. It allows your health care provider to see your heart in finer detail. °· Cardiac monitoring. This allows your health care provider to monitor your heart rate and rhythm in real time. °· Holter monitor. This is a portable device that records your heartbeat and can help to diagnose abnormal heartbeats. It allows your health care provider to track your heart activity for several days, if needed. °· Stress tests. These can be done through exercise or by taking medicine that makes your heart beat more quickly. °· Blood tests. °· Imaging tests. °TREATMENT  °Your treatment depends on what is causing your chest pain. Treatment may include: °· Medicines. These may include: °¨ Acid blockers for heartburn. °¨ Anti-inflammatory medicine. °¨ Pain medicine for inflammatory conditions. °¨ Antibiotic medicine, if an infection is present. °¨ Medicines to dissolve blood clots. °¨ Medicines to treat coronary artery disease. °· Supportive care for conditions that do not require medicines. This may include: °¨ Resting. °¨ Applying heat   or cold packs to injured areas. °¨ Limiting activities until pain decreases. °HOME CARE INSTRUCTIONS °· If you were prescribed an antibiotic medicine, finish it all even if you start to feel better. °· Avoid any activities that bring on chest pain. °· Do not use any tobacco products, including  cigarettes, chewing tobacco, or electronic cigarettes. If you need help quitting, ask your health care provider. °· Do not drink alcohol. °· Take medicines only as directed by your health care provider. °· Keep all follow-up visits as directed by your health care provider. This is important. This includes any further testing if your chest pain does not go away. °· If heartburn is the cause for your chest pain, you may be told to keep your head raised (elevated) while sleeping. This reduces the chance that acid will go from your stomach into your esophagus. °· Make lifestyle changes as directed by your health care provider. These may include: °¨ Getting regular exercise. Ask your health care provider to suggest some activities that are safe for you. °¨ Eating a heart-healthy diet. A registered dietitian can help you to learn healthy eating options. °¨ Maintaining a healthy weight. °¨ Managing diabetes, if necessary. °¨ Reducing stress. °SEEK MEDICAL CARE IF: °· Your chest pain does not go away after treatment. °· You have a rash with blisters on your chest. °· You have a fever. °SEEK IMMEDIATE MEDICAL CARE IF:  °· Your chest pain is worse. °· You have an increasing cough, or you cough up blood. °· You have severe abdominal pain. °· You have severe weakness. °· You faint. °· You have chills. °· You have sudden, unexplained chest discomfort. °· You have sudden, unexplained discomfort in your arms, back, neck, or jaw. °· You have shortness of breath at any time. °· You suddenly start to sweat, or your skin gets clammy. °· You feel nauseous or you vomit. °· You suddenly feel light-headed or dizzy. °· Your heart begins to beat quickly, or it feels like it is skipping beats. °These symptoms may represent a serious problem that is an emergency. Do not wait to see if the symptoms will go away. Get medical help right away. Call your local emergency services (911 in the [place] Do not drive yourself to the hospital. °  °This  information is not intended to replace advice given to you by your health care provider. Make sure you discuss any questions you have with your health care provider. °  °Document Released: 02/15/2005 Document Revised: 05/29/2014 Document Reviewed: 12/12/2013 °Elsevier Interactive Patient Education ©2016 [employer] ° °

## 2015-10-15 NOTE — ED Notes (Signed)
Patient woke up tonight with left sided chest pain that goes into the back and causes left arm pain. A nagging cough is present and is new. States he broke out in a sweat and felt nauseated. During a bowel movement had episode of nausea. Took a shower hoping to alleviate the pain but did not work.

## 2015-10-15 NOTE — ED Notes (Signed)
Patient now states his lymph nodes in his neck are swollen.

## 2015-11-22 ENCOUNTER — Ambulatory Visit (INDEPENDENT_AMBULATORY_CARE_PROVIDER_SITE_OTHER): Payer: 59 | Admitting: Gastroenterology

## 2015-11-22 ENCOUNTER — Other Ambulatory Visit: Payer: Self-pay

## 2015-11-22 ENCOUNTER — Encounter: Payer: Self-pay | Admitting: Gastroenterology

## 2015-11-22 VITALS — BP 128/79 | HR 81 | Temp 99.1°F | Ht 73.0 in | Wt 276.0 lb

## 2015-11-22 DIAGNOSIS — K219 Gastro-esophageal reflux disease without esophagitis: Secondary | ICD-10-CM | POA: Diagnosis not present

## 2015-11-22 DIAGNOSIS — R079 Chest pain, unspecified: Secondary | ICD-10-CM | POA: Diagnosis not present

## 2015-11-22 MED ORDER — PANTOPRAZOLE SODIUM 40 MG PO TBEC
40.0000 mg | DELAYED_RELEASE_TABLET | Freq: Every day | ORAL | Status: DC
Start: 2015-11-22 — End: 2017-01-06

## 2015-11-22 MED ORDER — PANTOPRAZOLE SODIUM 40 MG PO TBEC: 40.0000 mg | DELAYED_RELEASE_TABLET | Freq: Every day | ORAL | Status: DC

## 2015-11-22 NOTE — Progress Notes (Signed)
Gastroenterology Consultation  Referring Provider:     Eustaquio BoydenGutierrez, Javier, MD Primary Care Physician:  Eustaquio BoydenJavier Gutierrez, MD Primary Gastroenterologist:  Dr. Servando SnareWohl     Reason for Consultation:     Chest pain        HPI:   Micheal Jacobs is a 35 y.o. y/o male referred for consultation & management of Chest pain by Dr. Eustaquio BoydenJavier Gutierrez, MD.  This patient comes here today after being in the emergency room for chest pain.  The patient was started on Protonix and states that his chest pain got better until he ran out of it last Wednesday.  The patient then had another episode of chest pain on Friday.  The patient states that when he was 35 years old he was diagnosed with esophageal spasms.  The patient also reports that there are times when he eats and food get stuck.  He states that it will be brought up easily by him retching or vomiting the food up.  There is no report of any black stools or bloody stools.  He also denies any abdominal pain, fevers, chills, nausea or vomiting.  The patient does report that when he had the esophageal pain he also had it radiating into his back and left arm.  Past Medical History  Diagnosis Date  . Colitis 2000s    ?diverticulitis at age 35 yo  . GERD (gastroesophageal reflux disease)   . Smoker   . Obesity   . History of kidney stones   . History of chicken pox     Past Surgical History  Procedure Laterality Date  . Tonsillectomy  1986  . Elbow fracture surgery  1990s  . Wisdom tooth extraction      Prior to Admission medications   Medication Sig Start Date End Date Taking? Authorizing Provider  calcium carbonate (TUMS - DOSED IN MG ELEMENTAL CALCIUM) 500 MG chewable tablet Chew 1 tablet by mouth daily as needed for indigestion or heartburn.   Yes Historical Provider, MD  phentermine (ADIPEX-P) 37.5 MG tablet Take 37.5 mg by mouth daily. 10/05/15  Yes Historical Provider, MD  naproxen sodium (ANAPROX) 220 MG tablet Take 660 mg by mouth as needed.  Reported on 11/22/2015    Historical Provider, MD  pantoprazole (PROTONIX) 40 MG tablet Take 1 tablet (40 mg total) by mouth daily. 11/22/15 12/23/15  Midge Miniumarren Kristyna Bradstreet, MD  triamcinolone cream (KENALOG) 0.1 % Apply 1 application topically 2 (two) times daily. Apply to AA. Patient not taking: Reported on 11/22/2015 01/13/14   Eustaquio BoydenJavier Gutierrez, MD    Family History  Problem Relation Age of Onset  . Heart disease Father 5555    CHF  . Diabetes Father   . Arthritis Father   . CAD Paternal Grandfather 1150    MI  . Hypertension Mother   . Hyperlipidemia Mother   . Cancer Neg Hx   . Stroke Neg Hx   . Thyroid disease Neg Hx      Social History  Substance Use Topics  . Smoking status: Current Every Day Smoker -- 0.10 packs/day    Types: Cigarettes    Start date: 05/22/1998  . Smokeless tobacco: Never Used     Comment: 2-3 cigarettes/day  . Alcohol Use: Yes     Comment: 2/monthly    Allergies as of 11/22/2015 - Review Complete 11/22/2015  Allergen Reaction Noted  . Shellfish allergy Itching and Swelling 07/02/2012    Review of Systems:    All systems reviewed and negative  except where noted in HPI.   Physical Exam:  BP 128/79 mmHg  Pulse 81  Temp(Src) 99.1 F (37.3 C) (Oral)  Ht 6\' 1"  (1.854 m)  Wt 276 lb (125.193 kg)  BMI 36.42 kg/m2 No LMP for male patient. Psych:  Alert and cooperative. Normal mood and affect. General:   Alert,  Well-developed, well-nourished, pleasant and cooperative in NAD Head:  Normocephalic and atraumatic. Eyes:  Sclera clear, no icterus.   Conjunctiva pink. Ears:  Normal auditory acuity. Nose:  No deformity, discharge, or lesions. Mouth:  No deformity or lesions,oropharynx pink & moist. Neck:  Supple; no masses or thyromegaly. Lungs:  Respirations even and unlabored.  Clear throughout to auscultation.   No wheezes, crackles, or rhonchi. No acute distress. Heart:  Regular rate and rhythm; no murmurs, clicks, rubs, or gallops. Abdomen:  Normal bowel sounds.  No  bruits.  Soft, non-tender and non-distended without masses, hepatosplenomegaly or hernias noted.  No guarding or rebound tenderness.  Negative Carnett sign.   Rectal:  Deferred.  Msk:  Symmetrical without gross deformities.  Good, equal movement & strength bilaterally. Pulses:  Normal pulses noted. Extremities:  No clubbing or edema.  No cyanosis. Neurologic:  Alert and oriented x3;  grossly normal neurologically. Skin:  Intact without significant lesions or rashes.  No jaundice. Lymph Nodes:  No significant cervical adenopathy. Psych:  Alert and cooperative. Normal mood and affect.  Imaging Studies: No results found.  Assessment and Plan:   Micheal CourserJohnathan Jacobs is a 35 y.o. y/o male who comes here after being in the emergency room with chest pain.  The patient states that the pain went away while he was taking Protonix but he ran out.  The patient also has some report of dysphasia to solids.  The patient will be set up for an EGD and he will also be given a prescription for his Protonix. I have discussed risks & benefits which include, but are not limited to, bleeding, infection, perforation & drug reaction.  The patient agrees with this plan & written consent will be obtained.      Note: This dictation was prepared with Dragon dictation along with smaller phrase technology. Any transcriptional errors that result from this process are unintentional.

## 2015-11-24 ENCOUNTER — Encounter: Payer: Self-pay | Admitting: *Deleted

## 2015-11-24 NOTE — Discharge Instructions (Signed)

## 2015-11-25 ENCOUNTER — Ambulatory Visit: Payer: 59 | Admitting: Anesthesiology

## 2015-11-25 ENCOUNTER — Ambulatory Visit
Admission: RE | Admit: 2015-11-25 | Discharge: 2015-11-25 | Disposition: A | Discharge status: Home / Self Care | Payer: 59 | Source: Ambulatory Visit | Attending: Gastroenterology | Admitting: Gastroenterology

## 2015-11-25 ENCOUNTER — Encounter
Admission: RE | Disposition: A | Discharge status: Home / Self Care | Payer: Self-pay | Source: Ambulatory Visit | Attending: Gastroenterology

## 2015-11-25 DIAGNOSIS — K222 Esophageal obstruction: Secondary | ICD-10-CM | POA: Diagnosis not present

## 2015-11-25 DIAGNOSIS — R12 Heartburn: Secondary | ICD-10-CM | POA: Diagnosis not present

## 2015-11-25 DIAGNOSIS — Z8261 Family history of arthritis: Secondary | ICD-10-CM | POA: Diagnosis not present

## 2015-11-25 DIAGNOSIS — K219 Gastro-esophageal reflux disease without esophagitis: Secondary | ICD-10-CM | POA: Diagnosis not present

## 2015-11-25 DIAGNOSIS — Z8249 Family history of ischemic heart disease and other diseases of the circulatory system: Secondary | ICD-10-CM | POA: Insufficient documentation

## 2015-11-25 DIAGNOSIS — Z87442 Personal history of urinary calculi: Secondary | ICD-10-CM | POA: Diagnosis not present

## 2015-11-25 DIAGNOSIS — Z6836 Body mass index (BMI) 36.0-36.9, adult: Secondary | ICD-10-CM | POA: Insufficient documentation

## 2015-11-25 DIAGNOSIS — Z79899 Other long term (current) drug therapy: Secondary | ICD-10-CM | POA: Insufficient documentation

## 2015-11-25 DIAGNOSIS — Z833 Family history of diabetes mellitus: Secondary | ICD-10-CM | POA: Diagnosis not present

## 2015-11-25 DIAGNOSIS — F17210 Nicotine dependence, cigarettes, uncomplicated: Secondary | ICD-10-CM | POA: Diagnosis not present

## 2015-11-25 DIAGNOSIS — E669 Obesity, unspecified: Secondary | ICD-10-CM | POA: Diagnosis not present

## 2015-11-25 DIAGNOSIS — K2970 Gastritis, unspecified, without bleeding: Secondary | ICD-10-CM | POA: Insufficient documentation

## 2015-11-25 HISTORY — PX: ESOPHAGOGASTRODUODENOSCOPY (EGD) WITH PROPOFOL: SHX5813

## 2015-11-25 SURGERY — ESOPHAGOGASTRODUODENOSCOPY (EGD) WITH PROPOFOL
Anesthesia: Monitor Anesthesia Care | Wound class: Clean Contaminated

## 2015-11-25 MED ORDER — LIDOCAINE HCL (CARDIAC) 20 MG/ML IV SOLN
INTRAVENOUS | Status: DC | PRN
  Administered 2015-11-25: 50 mg via INTRAVENOUS

## 2015-11-25 MED ORDER — LACTATED RINGERS IV SOLN
INTRAVENOUS | Status: DC
  Administered 2015-11-25: 12:00:00 via INTRAVENOUS

## 2015-11-25 MED ORDER — STERILE WATER FOR IRRIGATION IR SOLN
Status: DC | PRN
  Administered 2015-11-25: 13:00:00

## 2015-11-25 MED ORDER — PROPOFOL 10 MG/ML IV BOLUS
INTRAVENOUS | Status: DC | PRN
  Administered 2015-11-25: 40 mg via INTRAVENOUS
  Administered 2015-11-25: 50 mg via INTRAVENOUS
  Administered 2015-11-25: 80 mg via INTRAVENOUS
  Administered 2015-11-25: 40 mg via INTRAVENOUS
  Administered 2015-11-25: 50 mg via INTRAVENOUS
  Administered 2015-11-25: 40 mg via INTRAVENOUS
  Administered 2015-11-25: 50 mg via INTRAVENOUS
  Administered 2015-11-25: 20 mg via INTRAVENOUS

## 2015-11-25 MED ORDER — ACETAMINOPHEN 160 MG/5ML PO SOLN: 325.0000 mg | ORAL | Status: DC | PRN

## 2015-11-25 MED ORDER — ACETAMINOPHEN 325 MG PO TABS: 325.0000 mg | ORAL_TABLET | ORAL | Status: DC | PRN

## 2015-11-25 MED ORDER — GLYCOPYRROLATE 0.2 MG/ML IJ SOLN
INTRAMUSCULAR | Status: DC | PRN
  Administered 2015-11-25: 0.2 mg via INTRAVENOUS

## 2015-11-25 SURGICAL SUPPLY — 32 items
BALLN DILATOR 10-12 8 (BALLOONS)
BALLN DILATOR 12-15 8 (BALLOONS)
BALLN DILATOR 15-18 8 (BALLOONS) ×3
BALLN DILATOR CRE 0-12 8 (BALLOONS)
BALLN DILATOR ESOPH 8 10 CRE (MISCELLANEOUS) IMPLANT
BALLOON DILATOR 12-15 8 (BALLOONS) IMPLANT
BALLOON DILATOR 15-18 8 (BALLOONS) ×1 IMPLANT
BALLOON DILATOR CRE 0-12 8 (BALLOONS) IMPLANT
BLOCK BITE 60FR ADLT L/F GRN (MISCELLANEOUS) ×3 IMPLANT
CANISTER SUCT 1200ML W/VALVE (MISCELLANEOUS) ×3 IMPLANT
CLIP HMST 235XBRD CATH ROT (MISCELLANEOUS) IMPLANT
CLIP RESOLUTION 360 11X235 (MISCELLANEOUS)
FCP ESCP3.2XJMB 240X2.8X (MISCELLANEOUS)
FORCEPS BIOP RAD 4 LRG CAP 4 (CUTTING FORCEPS) ×3 IMPLANT
FORCEPS BIOP RJ4 240 W/NDL (MISCELLANEOUS)
FORCEPS ESCP3.2XJMB 240X2.8X (MISCELLANEOUS) IMPLANT
GOWN CVR UNV OPN BCK APRN NK (MISCELLANEOUS) ×2 IMPLANT
GOWN ISOL THUMB LOOP REG UNIV (MISCELLANEOUS) ×4
INJECTOR VARIJECT VIN23 (MISCELLANEOUS) IMPLANT
KIT DEFENDO VALVE AND CONN (KITS) IMPLANT
KIT ENDO PROCEDURE OLY (KITS) ×3 IMPLANT
MARKER SPOT ENDO TATTOO 5ML (MISCELLANEOUS) IMPLANT
PAD GROUND ADULT SPLIT (MISCELLANEOUS) IMPLANT
RETRIEVER NET PLAT FOOD (MISCELLANEOUS) IMPLANT
SNARE SHORT THROW 13M SML OVAL (MISCELLANEOUS) IMPLANT
SNARE SHORT THROW 30M LRG OVAL (MISCELLANEOUS) IMPLANT
SPOT EX ENDOSCOPIC TATTOO (MISCELLANEOUS)
SYR INFLATION 60ML (SYRINGE) ×3 IMPLANT
TRAP ETRAP POLY (MISCELLANEOUS) IMPLANT
VARIJECT INJECTOR VIN23 (MISCELLANEOUS)
WATER STERILE IRR 250ML POUR (IV SOLUTION) ×3 IMPLANT
WIRE CRE 18-20MM 8CM F G (MISCELLANEOUS) IMPLANT

## 2015-11-25 NOTE — Op Note (Signed)
[facility] Gastroenterology Patient Name: Micheal Jacobs Procedure Date: 11/25/2015 12:27 PM MRN: [id number] Account #: [id number] Date of Birth: 1980/09/11 Admit Type: Outpatient Age: 35 [facility] Gender: Male Note Status: Finalized Procedure:            Upper GI endoscopy Indications:          Heartburn Providers:            Midge Miniumarren Yuniel Blaney, MD Referring MD:         Eustaquio BoydenJavier Gutierrez (Referring MD) Medicines:            Propofol per Anesthesia Complications:        No immediate complications. Procedure:            Pre-Anesthesia Assessment:                       - Prior to the procedure, a History and Physical was                        performed, and patient medications and allergies were                        reviewed. The patient's tolerance of previous                        anesthesia was also reviewed. The risks and benefits of                        the procedure and the sedation options and risks were                        discussed with the patient. All questions were                        answered, and informed consent was obtained. Prior                        Anticoagulants: The patient has taken no previous                        anticoagulant or antiplatelet agents. ASA Grade                        Assessment: II - A patient with mild systemic disease.                        After reviewing the risks and benefits, the patient was                        deemed in satisfactory condition to undergo the                        procedure.                       After obtaining informed consent, the endoscope was                        passed under direct vision. Throughout the procedure,  the patient's blood pressure, pulse, and oxygen                        saturations were monitored continuously. The Olympus                        GIF H180J endoscope (S#: [id number]) was introduced                        through the  mouth, and advanced to the second part of                        duodenum. The upper GI endoscopy was accomplished                        without difficulty. The patient tolerated the procedure                        well. Findings:      One moderate benign-appearing, intrinsic stenosis was found at the       gastroesophageal junction. And was traversed. A TTS dilator was passed       through the scope. Dilation with a 15-16.5-18 mm balloon dilator was       performed to 18 mm. The dilation site was examined and showed complete       resolution of luminal narrowing.      Localized mild inflammation characterized by erythema was found in the       gastric antrum. Biopsies were taken with a cold forceps for histology.      The examined duodenum was normal. Impression:           - Benign-appearing esophageal stenosis. Dilated.                       - Gastritis. Biopsied.                       - Normal examined duodenum. Recommendation:       - Await pathology results.                       - Continue present medications. Procedure Code(s):    --- Professional ---                       [id number], Esophagogastroduodenoscopy, flexible, transoral;                        with transendoscopic balloon dilation of esophagus                        (less than 30 mm diameter)                       [id number], Esophagogastroduodenoscopy, flexible, transoral;                        with biopsy, single or multiple Diagnosis Code(s):    --- Professional ---                       R12, Heartburn  K29.70, Gastritis, unspecified, without bleeding                       K22.2, Esophageal obstruction CPT copyright 2016 [employer]. All rights reserved. The codes documented in this report are preliminary and upon coder review may  be revised to meet current compliance requirements. Midge Minium, MD 11/25/2015 12:39:51 PM This report has been signed electronically. Number of Addenda:  0 Note Initiated On: 11/25/2015 12:27 PM Total Procedure Duration: 0 hours 6 minutes 37 seconds       [facility]

## 2015-11-25 NOTE — Anesthesia Preprocedure Evaluation (Signed)
Anesthesia Evaluation  Patient identified by MRN, date of birth, ID band  Reviewed: Allergy & Precautions, H&P , NPO status , Patient's Chart, lab work & pertinent test results  Airway Mallampati: III  TM Distance: >3 FB Neck ROM: full    Dental no notable dental hx.    Pulmonary Current Smoker,    Pulmonary exam normal        Cardiovascular  Rhythm:regular Rate:Normal     Neuro/Psych    GI/Hepatic GERD  ,  Endo/Other    Renal/GU      Musculoskeletal   Abdominal   Peds  Hematology   Anesthesia Other Findings   Reproductive/Obstetrics                             Anesthesia Physical Anesthesia Plan  ASA: II  Anesthesia Plan: MAC   Post-op Pain Management:    Induction:   Airway Management Planned:   Additional Equipment:   Intra-op Plan:   Post-operative Plan:   Informed Consent: I have reviewed the patients History and Physical, chart, labs and discussed the procedure including the risks, benefits and alternatives for the proposed anesthesia with the patient or authorized representative who has indicated his/her understanding and acceptance.     Plan Discussed with: CRNA  Anesthesia Plan Comments:         Anesthesia Quick Evaluation

## 2015-11-25 NOTE — Anesthesia Postprocedure Evaluation (Signed)
Anesthesia Post Note  Patient: Micheal CurtisJohnathan T Jacobs  Procedure(s) Performed: Procedure(s) (LRB): ESOPHAGOGASTRODUODENOSCOPY (EGD) WITH PROPOFOL with dialation (N/A)  Patient location during evaluation: PACU Anesthesia Type: MAC Level of consciousness: awake and alert and oriented Pain management: satisfactory to patient Vital Signs Assessment: post-procedure vital signs reviewed and stable Respiratory status: spontaneous breathing, nonlabored ventilation and respiratory function stable Cardiovascular status: blood pressure returned to baseline and stable Postop Assessment: Adequate PO intake and No signs of nausea or vomiting Anesthetic complications: no    Cherly BeachStella, Lolamae Voisin J

## 2015-11-25 NOTE — Transfer of Care (Signed)
Immediate Anesthesia Transfer of Care Note  Patient: Micheal CurtisJohnathan T Jacobs  Procedure(s) Performed: Procedure(s): ESOPHAGOGASTRODUODENOSCOPY (EGD) WITH PROPOFOL with dialation (N/A)  Patient Location: PACU  Anesthesia Type: MAC  Level of Consciousness: awake, alert  and patient cooperative  Airway and Oxygen Therapy: Patient Spontanous Breathing and Patient connected to supplemental oxygen  Post-op Assessment: Post-op Vital signs reviewed, Patient's Cardiovascular Status Stable, Respiratory Function Stable, Patent Airway and No signs of Nausea or vomiting  Post-op Vital Signs: Reviewed and stable  Complications: No apparent anesthesia complications

## 2015-11-25 NOTE — H&P (Signed)
Micheal Miniumarren Elmira Olkowski, MD [facility] [address] [place], [place] [address] Phone: [phone] Fax : [phone]  Primary Care Physician:  Micheal BoydenJavier Gutierrez, MD Primary Gastroenterologist:  Dr. Servando SnareWohl  Pre-Procedure History & Physical: HPI:  Micheal Jacobs is a 35 y.o. male is here for an endoscopy.   Past Medical History  Diagnosis Date  . Colitis 2000s    ?diverticulitis at age 35 yo  . GERD (gastroesophageal reflux disease)   . Smoker   . Obesity   . History of kidney stones   . History of chicken pox     Past Surgical History  Procedure Laterality Date  . Tonsillectomy  1986  . Elbow fracture surgery  1990s  . Wisdom tooth extraction      Prior to Admission medications   Medication Sig Start Date End Date Taking? Authorizing Provider  calcium carbonate (TUMS - DOSED IN MG ELEMENTAL CALCIUM) 500 MG chewable tablet Chew 1 tablet by mouth daily as needed for indigestion or heartburn.   Yes Historical Provider, MD  naproxen sodium (ANAPROX) 220 MG tablet Take 660 mg by mouth as needed. Reported on 11/22/2015   Yes Historical Provider, MD  pantoprazole (PROTONIX) 40 MG tablet Take 1 tablet (40 mg total) by mouth daily. 11/22/15 12/23/15 Yes Micheal Miniumarren Harlem Thresher, MD    Allergies as of 11/22/2015 - Review Complete 11/22/2015  Allergen Reaction Noted  . Shellfish allergy Itching and Swelling 07/02/2012    Family History  Problem Relation Age of Onset  . Heart disease Father 2455    CHF  . Diabetes Father   . Arthritis Father   . CAD Paternal Grandfather [id number]    MI  . Hypertension Mother   . Hyperlipidemia Mother   . Cancer Neg Hx   . Stroke Neg Hx   . Thyroid disease Neg Hx     Social History   Social History  . Marital Status: Married    Spouse Name: N/A  . Number of Children: N/A  . Years of Education: N/A   Occupational History  . Not on file.   Social History Main Topics  . Smoking status: Current Every Day Smoker -- 0.50 packs/day for 15 years    Types: Cigarettes      Start date: 05/22/1998  . Smokeless tobacco: Never Used     Comment:    . Alcohol Use: Yes     Comment: 2/monthly  . Drug Use: No  . Sexual Activity: Yes   Other Topics Concern  . Not on file   Social History Narrative   "[employer]"   Caffeine: none   Lives with wife, 3 children.  2 dogs at home, 2 goldfish   Occupation: manages [employer]   Edu: HS   Activity: no regular exercise   Diet: good water, good vegetables, seldom fruits, red meat 2-3x/wk, fish never    Review of Systems: See HPI, otherwise negative ROS  Physical Exam: Ht 6\' 1"  (1.854 m)  Wt 276 lb (125.193 kg)  BMI 36.42 kg/m2 General:   Alert,  pleasant and cooperative in NAD Head:  Normocephalic and atraumatic. Neck:  Supple; no masses or thyromegaly. Lungs:  Clear throughout to auscultation.    Heart:  Regular rate and rhythm. Abdomen:  Soft, nontender and nondistended. Normal bowel sounds, without guarding, and without rebound.   Neurologic:  Alert and  oriented x4;  grossly normal neurologically.  Impression/Plan: Micheal Jacobs is here for an endoscopy to be performed for GERD  Risks, benefits,  limitations, and alternatives regarding  endoscopy have been reviewed with the patient.  Questions have been answered.  All parties agreeable.   Micheal Miniumarren Janelle Culton, MD  11/25/2015, 11:17 AM

## 2015-11-25 NOTE — Anesthesia Procedure Notes (Signed)
Procedure Name: MAC Performed by: Kaylon Laroche Pre-anesthesia Checklist: Patient identified, Emergency Drugs available, Suction available, Timeout performed and Patient being monitored Patient Re-evaluated:Patient Re-evaluated prior to inductionOxygen Delivery Method: Nasal cannula Placement Confirmation: positive ETCO2       

## 2015-11-26 ENCOUNTER — Encounter: Payer: Self-pay | Admitting: Gastroenterology

## 2015-11-30 ENCOUNTER — Encounter: Payer: Self-pay | Admitting: Gastroenterology

## 2015-12-02 ENCOUNTER — Encounter: Payer: Self-pay | Admitting: Gastroenterology

## 2015-12-04 ENCOUNTER — Encounter: Payer: Self-pay | Admitting: Family Medicine

## 2016-12-01 ENCOUNTER — Ambulatory Visit (INDEPENDENT_AMBULATORY_CARE_PROVIDER_SITE_OTHER): Payer: 59 | Admitting: Family Medicine

## 2016-12-01 ENCOUNTER — Encounter: Payer: Self-pay | Admitting: Family Medicine

## 2016-12-01 ENCOUNTER — Ambulatory Visit
Admission: RE | Admit: 2016-12-01 | Discharge: 2016-12-01 | Disposition: A | Discharge status: Home / Self Care | Payer: 59 | Source: Ambulatory Visit | Attending: Family Medicine | Admitting: Family Medicine

## 2016-12-01 VITALS — BP 120/90 | HR 77 | Ht 73.0 in | Wt 316.0 lb

## 2016-12-01 DIAGNOSIS — M545 Low back pain, unspecified: Secondary | ICD-10-CM

## 2016-12-01 DIAGNOSIS — R319 Hematuria, unspecified: Secondary | ICD-10-CM

## 2016-12-01 DIAGNOSIS — K760 Fatty (change of) liver, not elsewhere classified: Secondary | ICD-10-CM | POA: Insufficient documentation

## 2016-12-01 HISTORY — DX: Low back pain, unspecified: M54.50

## 2016-12-01 LAB — POC URINALSYSI DIPSTICK (AUTOMATED)
Bilirubin, UA: NEGATIVE
GLUCOSE UA: NEGATIVE
Ketones, UA: NEGATIVE
Leukocytes, UA: NEGATIVE
Nitrite, UA: NEGATIVE
RBC UA: NEGATIVE
UROBILINOGEN UA: 0.2 U/dL
pH, UA: 6 (ref 5.0–8.0)

## 2016-12-01 LAB — CBC WITH DIFFERENTIAL/PLATELET
BASOS PCT: 0.5 % (ref 0.0–3.0)
Basophils Absolute: 0 10*3/uL (ref 0.0–0.1)
EOS ABS: 0.5 10*3/uL (ref 0.0–0.7)
Eosinophils Relative: 5.6 % — ABNORMAL HIGH (ref 0.0–5.0)
HCT: 46.4 % (ref 39.0–52.0)
Hemoglobin: 15.9 g/dL (ref 13.0–17.0)
Lymphocytes Relative: 23.2 % (ref 12.0–46.0)
Lymphs Abs: 2.2 10*3/uL (ref 0.7–4.0)
MCHC: 34.4 g/dL (ref 30.0–36.0)
MCV: 88.4 fl (ref 78.0–100.0)
Monocytes Absolute: 0.6 10*3/uL (ref 0.1–1.0)
Monocytes Relative: 6 % (ref 3.0–12.0)
NEUTROS ABS: 6 10*3/uL (ref 1.4–7.7)
NEUTROS PCT: 64.7 % (ref 43.0–77.0)
PLATELETS: 200 10*3/uL (ref 150.0–400.0)
RBC: 5.24 Mil/uL (ref 4.22–5.81)
RDW: 14.2 % (ref 11.5–15.5)
WBC: 9.3 10*3/uL (ref 4.0–10.5)

## 2016-12-01 LAB — BASIC METABOLIC PANEL
BUN: 21 mg/dL (ref 6–23)
CHLORIDE: 104 meq/L (ref 96–112)
CO2: 26 meq/L (ref 19–32)
Calcium: 10 mg/dL (ref 8.4–10.5)
Creatinine, Ser: 0.88 mg/dL (ref 0.40–1.50)
GFR: 104.61 mL/min (ref >60.00)
GLUCOSE: 83 mg/dL (ref 70–99)
POTASSIUM: 4.1 meq/L (ref 3.5–5.1)
Sodium: 139 mEq/L (ref 135–145)

## 2016-12-01 MED ORDER — TAMSULOSIN HCL 0.4 MG PO CAPS: 0.4000 mg | ORAL_CAPSULE | Freq: Every day | ORAL | 1 refills | Status: DC

## 2016-12-01 NOTE — Progress Notes (Signed)
BP 120/90   Pulse 77   Ht 6\' 1"  (1.854 m)   Wt (!) 316 lb (143.3 kg)   SpO2 98%   BMI 41.69 kg/m    CC: R LBP Subjective:    Patient ID: Micheal Jacobs, male    DOB: 13-Oct-1980, 36 y.o.   MRN: [id number]  HPI: Micheal Jacobs is a 36 y.o. male presenting on 12/01/2016 for Back Pain (right lower back pain, ) and Hematuria   Several month h/o intermittent R back pain below flank, described as sharp pain, unable to get comfortable. 5-6/10 pain. No radiation of pain. Intermittent blood in urine after back pain has been present for several days, last noticed 2.5 wks ago. Worse after jarring movement.  Pain improves with alcohol (2 beers).  Staying well hydrated with water.  Denies fevers/chills, nausea/vomiting, abd pain, trouble passing urine.   He regularly takes aleve once to twice daily.   History of kidney stones.  Has not seen urologist in the past.   Relevant past medical, surgical, family and social history reviewed and updated as indicated. Interim medical history since our last visit reviewed. Allergies and medications reviewed and updated. Outpatient Medications Prior to Visit  Medication Sig Dispense Refill  . naproxen sodium (ANAPROX) 220 MG tablet Take 660 mg by mouth as needed. Reported on 11/22/2015    . pantoprazole (PROTONIX) 40 MG tablet Take 1 tablet (40 mg total) by mouth daily. 30 tablet 11  . calcium carbonate (TUMS - DOSED IN MG ELEMENTAL CALCIUM) 500 MG chewable tablet Chew 1 tablet by mouth daily as needed for indigestion or heartburn.     No facility-administered medications prior to visit.      Per HPI unless specifically indicated in ROS section below Review of Systems     Objective:    BP 120/90   Pulse 77   Ht 6\' 1"  (1.854 m)   Wt (!) 316 lb (143.3 kg)   SpO2 98%   BMI 41.69 kg/m   Wt Readings from Last 3 Encounters:  12/01/16 (!) 316 lb (143.3 kg)  11/25/15 276 lb (125.2 kg)  11/22/15 276 lb (125.2 kg)    Physical Exam    Constitutional: He appears well-developed and well-nourished. No distress.  HENT:  Mouth/Throat: Oropharynx is clear and moist. No oropharyngeal exudate.  Abdominal: Soft. Normal appearance and bowel sounds are normal. He exhibits no distension and no mass. There is no hepatosplenomegaly. There is no tenderness. There is no rigidity, no rebound, no guarding, no CVA tenderness and negative Murphy's sign.  Musculoskeletal: He exhibits no edema.  No midline spine tenderness No paraspinous mm tenderness  Nursing note and vitals reviewed.  Results for orders placed or performed in visit on 12/01/16  POCT Urinalysis Dipstick (Automated)  Result Value Ref Range   Color, UA DARK YELLOW    Clarity, UA CLOUDY    Glucose, UA NEG    Bilirubin, UA NEG    Ketones, UA NEG    Spec Grav, UA >=1.030 (A) 1.010 - 1.025   Blood, UA NEG    pH, UA 6.0 5.0 - 8.0   Protein, UA TRACE    Urobilinogen, UA 0.2 0.2 or 1.0 E.U./dL   Nitrite, UA NEG    Leukocytes, UA Negative Negative      Assessment & Plan:   Problem List Items Addressed This Visit    Right low back pain - Primary    R flank and back pain ongoing for months associated  with intermittent hematuria in h/o kidney stones. Anticipate same. Not improving despite NSAID use. Will check CT abd/pelvis to eval stone burden and r/o hydronephrosis. Check labs today as well.  Rx flomax 0.4mg  nightly, continue aleve - pt declines stronger pain medication.      Relevant Orders   POCT Urinalysis Dipstick (Automated) (Completed)   CBC with Differential/Platelet   Basic metabolic panel   CT Abdomen Pelvis Wo Contrast    Other Visit Diagnoses    Hematuria, unspecified type       Relevant Orders   POCT Urinalysis Dipstick (Automated) (Completed)   CBC with Differential/Platelet   Basic metabolic panel   CT Abdomen Pelvis Wo Contrast       Follow up plan: Return if symptoms worsen or fail to improve.  Eustaquio BoydenJavier Lacy Sofia, MD

## 2016-12-01 NOTE — Addendum Note (Signed)
Addended by: Eustaquio BoydenGUTIERREZ, Aleira Deiter on: 12/01/2016 11:54 AM   Modules accepted: Level of Service

## 2016-12-01 NOTE — Progress Notes (Signed)
Pre visit review using our clinic review tool, if applicable. No additional management support is needed unless otherwise documented below in the visit note. 

## 2016-12-01 NOTE — Patient Instructions (Addendum)
I think this is recurrent kidney stone. See Micheal Jacobs to schedule CT scan - today.  Treat with flomax, continue aleve.  Labs today.   Kidney Stones Kidney stones (urolithiasis) are solid, rock-like deposits that form inside of the organs that make urine (kidneys). A kidney stone may form in a kidney and move into the bladder, where it can cause intense pain and block the flow of urine. Kidney stones are created when high levels of certain minerals are found in the urine. They are usually passed through urination, but in some cases, medical treatment may be needed to remove them. What are the causes? Kidney stones may be caused by:  A condition in which certain glands produce too much parathyroid hormone (primary hyperparathyroidism), which causes too much calcium buildup in the blood.  Buildup of uric acid crystals in the bladder (hyperuricosuria). Uric acid is a chemical that the body produces when you eat certain foods. It usually exits the body in the urine.  Narrowing (stricture) of one or both of the tubes that drain urine from the kidneys to the bladder (ureters).  A kidney blockage that is present at birth (congenital obstruction).  Past surgery on the kidney or the ureters, such as gastric bypass surgery.  What increases the risk? The following factors make you more likely to develop kidney stones:  Having had a kidney stone in the past.  Having a family history of kidney stones.  Not drinking enough water.  Eating a diet that is high in protein, salt (sodium), or sugar.  Being overweight or obese.  What are the signs or symptoms? Symptoms of a kidney stone may include:  Nausea.  Vomiting.  Blood in the urine (hematuria).  Pain in the side of the abdomen, right below the ribs (flank pain). Pain usually spreads (radiates) to the groin.  Needing to urinate frequently or urgently.  How is this diagnosed? This condition may be diagnosed based on:  Your medical  history.  A physical exam.  Blood tests.  Urine tests.  CT scan.  Abdominal X-ray.  A procedure to examine the inside of the bladder (cystoscopy).  How is this treated? Treatment for kidney stones depends on the size, location, and makeup of the stones. Treatment may involve:  Analyzing your urine before and after you pass the stone through urination.  Being monitored at the hospital until you pass the stone through urination.  Increasing your fluid intake and decreasing the amount of calcium and protein in your diet.  A procedure to break up kidney stones in the bladder using: ? A focused beam of light (laser therapy). ? Shock waves (extracorporeal shock wave lithotripsy).  Surgery to remove kidney stones. This may be needed if you have severe pain or have stones that block your urinary tract.  Follow these instructions at home: Eating and drinking   Drink enough fluid to keep your urine clear or pale yellow. This will help you to pass the kidney stone.  If directed, change your diet. This may include: ? Limiting how much sodium you eat. ? Eating more fruits and vegetables. ? Limiting how much meat, poultry, fish, and eggs you eat.  Follow instructions from your health care provider about eating or drinking restrictions. General instructions  Collect urine samples as told by your health care provider. You may need to collect a urine sample: ? 24 hours after you pass the stone. ? 8-12 weeks after passing the kidney stone, and every 6-12 months after that.  Strain your urine every time you urinate, for as long as directed. Use the strainer that your health care provider recommends.  Do not throw out the kidney stone after passing it. Keep the stone so it can be tested by your health care provider. Testing the makeup of your kidney stone may help prevent you from getting kidney stones in the future.  Take over-the-counter and prescription medicines only as told by  your health care provider.  Keep all follow-up visits as told by your health care provider. This is important. You may need follow-up X-rays or ultrasounds to make sure that your stone has passed. How is this prevented? To prevent another kidney stone:  Drink enough fluid to keep your urine clear or pale yellow. This is the best way to prevent kidney stones.  Eat a healthy diet and follow recommendations from your health care provider about foods to avoid. You may be instructed to eat a low-protein diet. Recommendations vary depending on the type of kidney stone that you have.  Maintain a healthy weight.  Contact a health care provider if:  You have pain that gets worse or does not get better with medicine. Get help right away if:  You have a fever or chills.  You develop severe pain.  You develop new abdominal pain.  You faint.  You are unable to urinate. This information is not intended to replace advice given to you by your health care provider. Make sure you discuss any questions you have with your health care provider. Document Released: 05/08/2005 Document Revised: 11/26/2015 Document Reviewed: 10/22/2015 Elsevier Interactive Patient Education  2017 [employer].

## 2016-12-01 NOTE — Assessment & Plan Note (Addendum)
R flank and back pain ongoing for months associated with intermittent hematuria in h/o kidney stones. Anticipate same. Not improving despite NSAID use. Will check CT abd/pelvis to eval stone burden and r/o hydronephrosis. Check labs today as well.  Rx flomax 0.4mg  nightly, continue aleve - pt declines stronger pain medication.

## 2016-12-04 ENCOUNTER — Encounter: Payer: Self-pay | Admitting: *Deleted

## 2017-01-06 ENCOUNTER — Other Ambulatory Visit: Payer: Self-pay | Admitting: Gastroenterology

## 2017-02-26 ENCOUNTER — Other Ambulatory Visit: Payer: Self-pay | Admitting: Gastroenterology

## 2017-03-06 ENCOUNTER — Other Ambulatory Visit: Payer: Self-pay

## 2017-03-08 ENCOUNTER — Telehealth: Payer: Self-pay | Admitting: Gastroenterology

## 2017-03-08 ENCOUNTER — Other Ambulatory Visit: Payer: Self-pay

## 2017-03-08 MED ORDER — PANTOPRAZOLE SODIUM 40 MG PO TBEC: 40.0000 mg | DELAYED_RELEASE_TABLET | Freq: Every day | ORAL | 6 refills | Status: DC

## 2017-03-08 NOTE — Telephone Encounter (Signed)
Rx sent to pt's pharmacy per his request.  

## 2017-03-08 NOTE — Telephone Encounter (Signed)
Patient left a voice message that he needs a refill but not sure what the medication is called.

## 2017-08-24 ENCOUNTER — Emergency Department: Payer: 59

## 2017-08-24 ENCOUNTER — Encounter: Payer: Self-pay | Admitting: Emergency Medicine

## 2017-08-24 ENCOUNTER — Emergency Department
Admission: EM | Admit: 2017-08-24 | Discharge: 2017-08-24 | Disposition: A | Discharge status: Home / Self Care | Payer: 59 | Attending: Emergency Medicine | Admitting: Emergency Medicine

## 2017-08-24 ENCOUNTER — Other Ambulatory Visit: Payer: Self-pay

## 2017-08-24 DIAGNOSIS — Z79899 Other long term (current) drug therapy: Secondary | ICD-10-CM | POA: Diagnosis not present

## 2017-08-24 DIAGNOSIS — F17210 Nicotine dependence, cigarettes, uncomplicated: Secondary | ICD-10-CM | POA: Insufficient documentation

## 2017-08-24 DIAGNOSIS — R1011 Right upper quadrant pain: Secondary | ICD-10-CM | POA: Insufficient documentation

## 2017-08-24 LAB — CBC
HCT: 50.2 % (ref 40.0–52.0)
HEMOGLOBIN: 17.1 g/dL (ref 13.0–18.0)
MCH: 29.7 pg (ref 26.0–34.0)
MCHC: 34.1 g/dL (ref 32.0–36.0)
MCV: 87.2 fL (ref 80.0–100.0)
PLATELETS: 224 10*3/uL (ref 150–440)
RBC: 5.76 MIL/uL (ref 4.40–5.90)
RDW: 13.9 % (ref 11.5–14.5)
WBC: 11.9 10*3/uL — ABNORMAL HIGH (ref 3.8–10.6)

## 2017-08-24 LAB — COMPREHENSIVE METABOLIC PANEL
ALBUMIN: 5 g/dL (ref 3.5–5.0)
ALK PHOS: 64 U/L (ref 38–126)
ALT: 80 U/L — AB (ref 17–63)
ANION GAP: 10 (ref 5–15)
AST: 46 U/L — AB (ref 15–41)
BILIRUBIN TOTAL: 0.7 mg/dL (ref 0.3–1.2)
BUN: 18 mg/dL (ref 6–20)
CALCIUM: 10.1 mg/dL (ref 8.9–10.3)
CO2: 28 mmol/L (ref 22–32)
CREATININE: 0.82 mg/dL (ref 0.61–1.24)
Chloride: 101 mmol/L (ref 101–111)
GFR calc Af Amer: >60 mL/min (ref >60)
GFR calc non Af Amer: >60 mL/min (ref >60)
GLUCOSE: 85 mg/dL (ref 65–99)
Potassium: 3.9 mmol/L (ref 3.5–5.1)
SODIUM: 139 mmol/L (ref 135–145)
TOTAL PROTEIN: 8.4 g/dL — AB (ref 6.5–8.1)

## 2017-08-24 LAB — URINALYSIS, COMPLETE (UACMP) WITH MICROSCOPIC
Bacteria, UA: NONE SEEN
Bilirubin Urine: NEGATIVE
GLUCOSE, UA: NEGATIVE mg/dL
HGB URINE DIPSTICK: NEGATIVE
Ketones, ur: NEGATIVE mg/dL
Leukocytes, UA: NEGATIVE
NITRITE: NEGATIVE
PROTEIN: NEGATIVE mg/dL
SPECIFIC GRAVITY, URINE: 1.024 (ref 1.005–1.030)
pH: 5 (ref 5.0–8.0)

## 2017-08-24 LAB — LIPASE, BLOOD: Lipase: 23 U/L (ref 11–51)

## 2017-08-24 MED ORDER — DICYCLOMINE HCL 20 MG PO TABS: 20.0000 mg | ORAL_TABLET | Freq: Three times a day (TID) | ORAL | 0 refills | Status: DC | PRN

## 2017-08-24 NOTE — ED Provider Notes (Addendum)
[facility] Emergency Department Provider Note  Time seen: 8:54 PM  I have reviewed the triage vital signs and the nursing notes.   HISTORY  Chief Complaint No chief complaint on file.    HPI Micheal Jacobs is a 37 y.o. male with a past medical history of gastric reflux, obesity, presents to the emergency department for right upper quadrant abdominal pain.  According to the patient 3 days ago he developed right back pain.  Thought he might of pulled a muscle but states that has progressively gotten worse and started to go to the front of his abdomen is well.  He has noticed that when he eats or drinks food the pain worsens.  He went to urgent care today and was sent to the emergency department for right upper quadrant tenderness.  Patient denies any fever.  Denies diarrhea.  Largely negative review of systems otherwise.  Describes pain as moderate currently but does not wish for any pain medication.   Past Medical History:  Diagnosis Date  . Colitis 2000s   ?diverticulitis at age 37 yo  . GERD (gastroesophageal reflux disease)   . History of chicken pox   . History of kidney stones   . Obesity   . Smoker     Patient Active Problem List   Diagnosis Date Noted  . Right low back pain 12/01/2016  . Heartburn   . Gastritis   . Stricture and stenosis of esophagus   . Headache(784.0) 01/13/2014  . Skin rash 01/13/2014  . Edema, peripheral 09/24/2012  . Transaminitis 09/24/2012  . GERD (gastroesophageal reflux disease)   . Smoker   . Obesity     Past Surgical History:  Procedure Laterality Date  . ELBOW FRACTURE SURGERY  1990s  . ESOPHAGOGASTRODUODENOSCOPY (EGD) WITH PROPOFOL N/A 11/25/2015   Procedure: ESOPHAGOGASTRODUODENOSCOPY (EGD) WITH PROPOFOL with dialation;  Midge Minium, MD; gastritis; benign biopsy  . TONSILLECTOMY  1986  . WISDOM TOOTH EXTRACTION      Prior to Admission medications   Medication Sig Start Date End Date Taking?  Authorizing Provider  naproxen sodium (ANAPROX) 220 MG tablet Take 660 mg by mouth as needed. Reported on 11/22/2015    [provider]  pantoprazole (PROTONIX) 40 MG tablet Take 1 tablet (40 mg total) by mouth daily. 03/08/17   Midge Minium, MD  tamsulosin (FLOMAX) 0.4 MG CAPS capsule Take 1 capsule (0.4 mg total) by mouth daily. 12/01/16   Eustaquio Boyden, MD    Allergies  Allergen Reactions  . Shellfish Allergy Shortness Of Breath and Itching    Trouble breathing, swelling, itching, rash    Family History  Problem Relation Age of Onset  . Heart disease Father 27       CHF  . Diabetes Father   . Arthritis Father   . Hypertension Mother   . Hyperlipidemia Mother   . CAD Paternal Grandfather 7       MI  . Cancer Neg Hx   . Stroke Neg Hx   . Thyroid disease Neg Hx     Social History Social History   Tobacco Use  . Smoking status: Current Every Day Smoker    Packs/day: 0.50    Years: 15.00    Pack years: 7.50    Types: Cigarettes    Start date: 05/22/1998  . Smokeless tobacco: Never Used  . Tobacco comment:    Substance Use Topics  . Alcohol use: Yes    Comment: 2/monthly  . Drug use:  No    Review of Systems Constitutional: Negative for fever. Eyes: Negative for visual complaints ENT: Negative for recent illness/congestion Cardiovascular: Negative for chest pain. Respiratory: Negative for shortness of breath. Gastrointestinal: Right upper quadrant pain.  Negative for nausea vomiting diarrhea Genitourinary: Negative for urinary compaints Musculoskeletal: Right back pain Skin: Negative for skin complaints  Neurological: Negative for headache All other ROS negative  ____________________________________________   PHYSICAL EXAM:  VITAL SIGNS: ED Triage Vitals  Enc Vitals Group     BP 08/24/17 1855 (!) 139/97     Pulse Rate 08/24/17 1855 99     Resp 08/24/17 1855 16     Temp 08/24/17 1855 98.3 F (36.8 C)     Temp Source 08/24/17 1855 Oral      SpO2 08/24/17 1855 100 %     Weight 08/24/17 1855 (!) 315 lb (142.9 kg)     Height 08/24/17 1855 6\' 2"  (1.88 m)     Head Circumference --      Peak Flow --      Pain Score 08/24/17 1905 5     Pain Loc --      Pain Edu? --      Excl. in GC? --     Constitutional: Alert and oriented. Well appearing and in no distress. Eyes: Normal exam ENT   Head: Normocephalic and atraumatic   Mouth/Throat: Mucous membranes are moist. Cardiovascular: Normal rate, regular rhythm. No murmur Respiratory: Normal respiratory effort without tachypnea nor retractions. Breath sounds are clear Gastrointestinal: Moderate right upper quadrant tenderness to palpation.  No rebound or guarding.  No distention. Musculoskeletal: Nontender with normal range of motion in all extremities. Neurologic:  Normal speech and language. No gross focal neurologic deficits  Skin:  Skin is warm, dry and intact.  Psychiatric: Mood and affect are normal.  ____________________________________________    EKG  EKG reviewed and interpreted by myself shows normal sinus rhythm at 92 bpm, narrow QRS, normal axis, normal intervals, no concerning ST changes.  ____________________________________________    RADIOLOGY  Ultrasound pending  ____________________________________________   INITIAL IMPRESSION / ASSESSMENT AND PLAN / ED COURSE  Pertinent labs & imaging results that were available during my care of the patient were reviewed by me and considered in my medical decision making (see chart for details).  Patient presents emergency department for right upper quadrant abdominal pain and right back pain for the past 3 days.  Worse with eating food.  Differential would include biliary colic, cholecystitis, gastritis, gastric or peptic ulcers.  Patient's labs show elevation in LFTs and a mild leukocytosis.  Otherwise largely within normal limits.  Will obtain a right upper quadrant ultrasound.  Highly suspect cholecystitis.   Currently patient appears well, calm, cooperative, no distress.  States moderate discomfort dull aching pain, but does not wish for any pain medication at this time.  Exam pending.  Patient care signed out to oncoming physician.  Ultrasound is negative.  I discussed with the patient obtaining a CT scan to further evaluate.  Patient states last year he had the same pain he had a CT scan that was normal.  This time he would rather hold off on CT imaging.  I discussed follow-up with general surgery for a HIDA scan to further evaluate his gallbladder as his symptoms seems most consistent with gallbladder discomfort but no obvious signs of cholecystitis at this time.  Patient agreeable to this plan will follow-up with general surgery. ____________________________________________   FINAL CLINICAL IMPRESSION(S) / ED DIAGNOSES  Right upper quadrant abdominal pain    Minna AntisPaduchowski, Surah Pelley, MD 08/24/17 [id number]    Minna AntisPaduchowski, Jakhi Dishman, MD 08/24/17 [phone]

## 2017-08-24 NOTE — ED Triage Notes (Signed)
Patient presents to the ED with lower left back pain since Tuesday.  Patient states he went back to work today and when he sat down in a car he felt "miserable."  Patient states he went to the urgent care today and had a very tender right upper quadrant.  Patient denies nausea and vomiting.

## 2017-08-24 NOTE — ED Triage Notes (Signed)
FIRST NURSE NOTE-here for right flank/abdominal pain.  Ambulatory. NAD

## 2017-09-03 ENCOUNTER — Ambulatory Visit
Admission: RE | Admit: 2017-09-03 | Discharge: 2017-09-03 | Disposition: A | Discharge status: Home / Self Care | Payer: 59 | Source: Ambulatory Visit | Attending: Surgery | Admitting: Surgery

## 2017-09-03 ENCOUNTER — Encounter: Payer: Self-pay | Admitting: Surgery

## 2017-09-03 ENCOUNTER — Ambulatory Visit (INDEPENDENT_AMBULATORY_CARE_PROVIDER_SITE_OTHER): Payer: 59 | Admitting: Surgery

## 2017-09-03 ENCOUNTER — Other Ambulatory Visit
Admission: RE | Admit: 2017-09-03 | Discharge: 2017-09-03 | Disposition: A | Discharge status: Home / Self Care | Payer: 59 | Source: Ambulatory Visit | Attending: Surgery | Admitting: Surgery

## 2017-09-03 ENCOUNTER — Other Ambulatory Visit: Payer: Self-pay

## 2017-09-03 VITALS — BP 145/85 | HR 88 | Temp 97.7°F | Ht 73.0 in | Wt 324.0 lb

## 2017-09-03 DIAGNOSIS — R1011 Right upper quadrant pain: Secondary | ICD-10-CM

## 2017-09-03 DIAGNOSIS — K760 Fatty (change of) liver, not elsewhere classified: Secondary | ICD-10-CM | POA: Insufficient documentation

## 2017-09-03 DIAGNOSIS — K219 Gastro-esophageal reflux disease without esophagitis: Secondary | ICD-10-CM

## 2017-09-03 LAB — COMPREHENSIVE METABOLIC PANEL
ALBUMIN: 4.8 g/dL (ref 3.5–5.0)
ALT: 85 U/L — ABNORMAL HIGH (ref 17–63)
ANION GAP: 10 (ref 5–15)
AST: 49 U/L — AB (ref 15–41)
Alkaline Phosphatase: 65 U/L (ref 38–126)
BUN: 17 mg/dL (ref 6–20)
CHLORIDE: 100 mmol/L — AB (ref 101–111)
CO2: 27 mmol/L (ref 22–32)
Calcium: 9.7 mg/dL (ref 8.9–10.3)
Creatinine, Ser: 0.79 mg/dL (ref 0.61–1.24)
GFR calc Af Amer: >60 mL/min (ref >60)
GFR calc non Af Amer: >60 mL/min (ref >60)
GLUCOSE: 78 mg/dL (ref 65–99)
POTASSIUM: 4.5 mmol/L (ref 3.5–5.1)
Sodium: 137 mmol/L (ref 135–145)
Total Bilirubin: 0.7 mg/dL (ref 0.3–1.2)
Total Protein: 8.2 g/dL — ABNORMAL HIGH (ref 6.5–8.1)

## 2017-09-03 LAB — LIPASE, BLOOD: Lipase: 21 U/L (ref 11–51)

## 2017-09-03 LAB — CBC WITH DIFFERENTIAL/PLATELET
Basophils Absolute: 0 10*3/uL (ref 0–0.1)
Basophils Relative: 1 %
EOS PCT: 3 %
Eosinophils Absolute: 0.2 10*3/uL (ref 0–0.7)
HEMATOCRIT: 47.5 % (ref 40.0–52.0)
Hemoglobin: 16.2 g/dL (ref 13.0–18.0)
LYMPHS ABS: 2.1 10*3/uL (ref 1.0–3.6)
LYMPHS PCT: 23 %
MCH: 30.2 pg (ref 26.0–34.0)
MCHC: 34.1 g/dL (ref 32.0–36.0)
MCV: 88.4 fL (ref 80.0–100.0)
MONO ABS: 0.5 10*3/uL (ref 0.2–1.0)
Monocytes Relative: 6 %
NEUTROS ABS: 5.9 10*3/uL (ref 1.4–6.5)
Neutrophils Relative %: 67 %
PLATELETS: 197 10*3/uL (ref 150–440)
RBC: 5.37 MIL/uL (ref 4.40–5.90)
RDW: 13.8 % (ref 11.5–14.5)
WBC: 8.8 10*3/uL (ref 3.8–10.6)

## 2017-09-03 MED ORDER — IOPAMIDOL (ISOVUE-370) INJECTION 76%: 125.0000 mL | Freq: Once | INTRAVENOUS | Status: DC | PRN

## 2017-09-03 MED ORDER — IOHEXOL 300 MG/ML  SOLN
125.0000 mL | Freq: Once | INTRAMUSCULAR | Status: AC | PRN
  Administered 2017-09-03: 125 mL via INTRAVENOUS

## 2017-09-03 NOTE — Addendum Note (Signed)
Addended by: Adela PortsBONICHE, Shenouda Genova on: 09/03/2017 01:12 PM   Modules accepted: Orders

## 2017-09-03 NOTE — Addendum Note (Signed)
Addended by: Adela PortsBONICHE, Keitra Carusone on: 09/03/2017 02:25 PM   Modules accepted: Orders

## 2017-09-03 NOTE — Progress Notes (Signed)
Micheal Jacobs is an 37 y.o. male.   Chief Complaint: Right upper quadrant pain  Consult requested by emergency room physician  HPI: This a patient who is in the emergency room recently with right upper quadrant pain no gallstones were seen on ultrasound.  With the negative ultrasound and mildly elevated liver function tests and the fact that he had had these symptoms previously he was sent home from the emergency room to follow-up for potential HIDA scan.  He has had an EGD by Dr. Daleen SquibbWall in 2017 for similar symptoms.  Patient has had prior kidney stones and thought that this pain was related to his kidney stone as it was on the right side and right flank and right lateral.  He does not point to an area anterior as the site of his pain.  His pain comes and goes multiple times during the day sometimes lasting hours.  It is not related to eating any kind of specific foods and does not notice it in relation to meals.  He has had no nausea vomiting fevers or chills.  He denies hematuria.  He has had kidney stones in the past.  Some of his symptoms seem positional and that he cannot lay down but can stand up.  He has no family history of gallbladder disease.  Past Medical History:  Diagnosis Date  . Colitis 2000s   ?diverticulitis at age 37 yo  . Edema, peripheral 09/24/2012  . Gastritis   . GERD (gastroesophageal reflux disease)   . Heartburn   . History of chicken pox   . History of kidney stones   . Obesity   . Right low back pain 12/01/2016  . Smoker   . Stricture and stenosis of esophagus     Past Surgical History:  Procedure Laterality Date  . ELBOW FRACTURE SURGERY  1990s  . ESOPHAGOGASTRODUODENOSCOPY (EGD) WITH PROPOFOL N/A 11/25/2015   Procedure: ESOPHAGOGASTRODUODENOSCOPY (EGD) WITH PROPOFOL with dialation;  Midge Miniumarren Wohl, MD; gastritis; benign biopsy  . TONSILLECTOMY  1986  . WISDOM TOOTH EXTRACTION      Family History  Problem Relation Age of Onset  . Heart disease Father [id number]        CHF  . Diabetes Father   . Arthritis Father   . Hypertension Mother   . Hyperlipidemia Mother   . CAD Paternal Grandfather [id number]       MI  . Cancer Neg Hx   . Stroke Neg Hx   . Thyroid disease Neg Hx    Social History:  reports that he has been smoking cigarettes.  He started smoking about 19 years ago. He has a 7.50 pack-year smoking history. He has never used smokeless tobacco. He reports that he drinks alcohol. He reports that he does not use drugs.  Allergies:  Allergies  Allergen Reactions  . Shellfish Allergy Shortness Of Breath, Itching and Anaphylaxis    Trouble breathing, swelling, itching, rash     (Not in a hospital admission)   Review of Systems:   Review of Systems  Constitutional: Negative.   HENT: Negative.   Eyes: Negative.   Respiratory: Negative.   Cardiovascular: Negative.   Gastrointestinal: Positive for abdominal pain and nausea. Negative for blood in stool, constipation, diarrhea, heartburn and vomiting.  Genitourinary: Negative.   Musculoskeletal: Negative.   Skin: Negative.   Neurological: Negative.   Endo/Heme/Allergies: Negative.   Psychiatric/Behavioral: Negative.     Physical Exam:  Physical Exam  Constitutional: He is oriented to person, place, and  time. He appears well-developed and well-nourished. No distress.  HENT:  Head: Normocephalic and atraumatic.  Eyes: Pupils are equal, round, and reactive to light. Right eye exhibits no discharge. Left eye exhibits no discharge. No scleral icterus.  Neck: Normal range of motion.  Cardiovascular: Normal rate, regular rhythm and normal heart sounds.  Pulmonary/Chest: Effort normal and breath sounds normal. No respiratory distress.  Abdominal: Soft. He exhibits no distension and no mass. There is no tenderness. There is no guarding.  Minimal tenderness right lateral and right flank.  There is minimal tenderness at the CVA percussion area.  This is on the right only.  No anterior abdominal  tenderness and negative Murphy sign  Musculoskeletal: Normal range of motion. He exhibits no edema or deformity.  Lymphadenopathy:    He has no cervical adenopathy.  Neurological: He is alert and oriented to person, place, and time.  Skin: Skin is warm and dry. He is not diaphoretic. No erythema.    There were no vitals taken for this visit.    No results found for this or any previous visit (from the past 48 hour(s)). No results found.   Assessment/Plan Labs and ultrasound are reviewed.  Mildly elevated liver function test AST and ALT.  Normal bilirubin.  Slight elevation of white blood cell count as well.  Ultrasound shows no stones no gallbladder wall thickening and no sludge as mentioned.  His symptoms do not sound like gallbladder disease.  He had a CT scan which was negative in July 2018.  He is reluctant to have another CT scan.  I recommend repeating labs because of the light elevated liver function tests.  I will check a lipase as well.  We will proceed with CT scan as he has symptoms that are fairly consistent with a prior history of kidney stones but a negative CT scan 10 months ago warrants a repeat now.  We will call him with those results.  We may need to proceed with either further GI workup or HIDA scan etc.  Lattie Haw, MD, FACS

## 2017-09-03 NOTE — Progress Notes (Signed)
Cbc

## 2017-09-04 ENCOUNTER — Telehealth: Payer: Self-pay

## 2017-09-04 NOTE — Telephone Encounter (Signed)
Called patient to let him know that his CT scan was normal. He has no abnormalities in his abdomen. However, patient's ALT is elevated. Dr. Excell Seltzerooper wants the patient to be seen by Dr. Servando SnareWohl (gastroenterologist). I then called Dr. Annabell SabalWohl's office and scheduled him an appointment to be seen on 09/25/2017 at 3:30 PM. This information was given to patient. Patient had no further questions.

## 2017-09-25 ENCOUNTER — Ambulatory Visit (INDEPENDENT_AMBULATORY_CARE_PROVIDER_SITE_OTHER): Payer: 59 | Admitting: Gastroenterology

## 2017-09-25 ENCOUNTER — Encounter: Payer: Self-pay | Admitting: Gastroenterology

## 2017-09-25 ENCOUNTER — Encounter (INDEPENDENT_AMBULATORY_CARE_PROVIDER_SITE_OTHER): Payer: Self-pay

## 2017-09-25 VITALS — BP 130/90 | HR 89 | Ht 73.0 in | Wt 324.2 lb

## 2017-09-25 DIAGNOSIS — R748 Abnormal levels of other serum enzymes: Secondary | ICD-10-CM

## 2017-09-25 NOTE — Progress Notes (Signed)
Primary Care Physician: Micheal Boyden, MD  Primary Gastroenterologist:  Dr. Midge Jacobs  Chief Complaint  Patient presents with  . Elevated Hepatic Enzymes    HPI: Micheal Jacobs is a 37 y.o. male here gor follow-up after having right-sided abdominal pain.  The patient reports that he had severe right-sided pain that started his back and then radiated to the front.  The patient then went to the ED and was found to have severe right upper quadrant tenderness.  The patient was seen by surgery who did not think that this was a gallbladder problem.  The patient states that his pain has completely resolved and he is not having any symptoms at the present time. He reports that he was drinking heavily this past weekend.  He does also report that he has gained weight recently and has a father who is obese with diabetes.  The patient's cholesterol has not been checked in many years and his most recent lab work does not show him to be diabetic.   Current Outpatient Medications  Medication Sig Dispense Refill  . pantoprazole (PROTONIX) 40 MG tablet Take 1 tablet (40 mg total) by mouth daily. 30 tablet 6  . dicyclomine (BENTYL) 20 MG tablet Take 1 tablet (20 mg total) by mouth 3 (three) times daily as needed for spasms. (Patient not taking: Reported on 09/03/2017) 30 tablet 0  . naproxen sodium (ANAPROX) 220 MG tablet Take 660 mg by mouth as needed. Reported on 11/22/2015     No current facility-administered medications for this visit.     Allergies as of 09/25/2017 - Review Complete 09/25/2017  Allergen Reaction Noted  . Shellfish allergy Shortness Of Breath, Itching, and Anaphylaxis 07/02/2012  . Bentyl [dicyclomine hcl] Palpitations 09/03/2017    ROS:  General: Negative for anorexia, weight loss, fever, chills, fatigue, weakness. ENT: Negative for hoarseness, difficulty swallowing , nasal congestion. CV: Negative for chest pain, angina, palpitations, dyspnea on exertion, peripheral  edema.  Respiratory: Negative for dyspnea at rest, dyspnea on exertion, cough, sputum, wheezing.  GI: See history of present illness. GU:  Negative for dysuria, hematuria, urinary incontinence, urinary frequency, nocturnal urination.  Endo: Negative for unusual weight change.    Physical Examination:   BP 130/90   Pulse 89   Ht  (1.854 m)   Wt (!) 324 lb 3.2 oz (147.1 kg)   BMI 42.77 kg/m   General: Well-nourished, well-developed in no acute distress.  Eyes: No icterus. Conjunctivae pink. Mouth: Oropharyngeal mucosa moist and pink , no lesions erythema or exudate. Lungs: Clear to auscultation bilaterally. Non-labored. Heart: Regular rate and rhythm, no murmurs rubs or gallops.  Abdomen: Bowel sounds are normal, nontender, nondistended, no hepatosplenomegaly or masses, no abdominal bruits or hernia , no rebound or guarding.   Extremities: No lower extremity edema. No clubbing or deformities. Neuro: Alert and oriented x 3.  Grossly intact. Skin: Warm and dry, no jaundice.   Psych: Alert and cooperative, normal mood and affect.  Labs:    Imaging Studies: Ct Abdomen Pelvis W Contrast  Result Date: 09/03/2017 CLINICAL DATA:  Right upper quadrant pain.  Suspect cholecystitis. EXAM: CT ABDOMEN AND PELVIS WITH CONTRAST TECHNIQUE: Multidetector CT imaging of the abdomen and pelvis was performed using the standard protocol following bolus administration of intravenous contrast. CONTRAST:  <See Chart> ISOVUE-370 IOPAMIDOL (ISOVUE-370) INJECTION 76%, OMNIPAQUE IOHEXOL 300 MG/ML SOLN COMPARISON:  CT abdomen pelvis 12/01/2016 FINDINGS: Lower chest: Lung bases clear Hepatobiliary: Extensive fatty infiltration of liver  without focal liver lesion. Normal gallbladder without gallbladder wall thickening or stone. No biliary dilatation. Pancreas: Negative Spleen: Negative Adrenals/Urinary Tract: Negative for urinary tract calculi. No obstruction or mass. Urinary bladder normal. Stomach/Bowel:  Negative for bowel obstruction. No bowel mass or edema. Sigmoid diverticulosis without diverticulitis. Normal appendix. Vascular/Lymphatic: Negative Reproductive: Normal prostate. Other: Negative for free fluid or mass lesion. Musculoskeletal: Mild spinal degenerative change without acute skeletal abnormality. IMPRESSION: No acute abnormality and no cause for right upper quadrant pain. Gallbladder appears normal by CT. If there is further concern of cholecystitis, ultrasound may be helpful Fatty infiltration of liver without focal liver lesion. Electronically Signed   By: Micheal Jacobs M.D.   On: 09/03/2017 14:00    Assessment and Plan:   Micheal Jacobs is a 37 y.o. y/o male who had some right-sided back pain that radiated to the front of the abdomen.  The patient states his abdominal pain is completely gone at this time.  The patient was found to have abnormal liver enzymes.  The patient will have his labs sent off for possible causes of abnormal liver enzymes.  The patient did have imaging that showed him to have fatty liver.  The patient has been encouraged to lose weight and will also have his labs checked for lipids.  The patient states that he has been drinking heavily over this past weekend so he would like to wait until next week to get all the blood work done.  The patient will have blood work done next week and will be notified with the results. The patient has been explained the plan and agrees with it.    Micheal Minium, MD. Micheal Jacobs   Note: This dictation was prepared with Dragon dictation along with smaller phrase technology. Any transcriptional errors that result from this process are unintentional.

## 2017-10-01 ENCOUNTER — Other Ambulatory Visit
Admission: RE | Admit: 2017-10-01 | Discharge: 2017-10-01 | Disposition: A | Discharge status: Home / Self Care | Payer: 59 | Source: Ambulatory Visit | Attending: Gastroenterology | Admitting: Gastroenterology

## 2017-10-01 DIAGNOSIS — R748 Abnormal levels of other serum enzymes: Secondary | ICD-10-CM | POA: Insufficient documentation

## 2017-10-01 LAB — HEPATIC FUNCTION PANEL
ALT: 112 U/L — ABNORMAL HIGH (ref 17–63)
AST: 63 U/L — ABNORMAL HIGH (ref 15–41)
Albumin: 5.1 g/dL — ABNORMAL HIGH (ref 3.5–5.0)
Alkaline Phosphatase: 59 U/L (ref 38–126)
BILIRUBIN TOTAL: 0.8 mg/dL (ref 0.3–1.2)
TOTAL PROTEIN: 8.5 g/dL — AB (ref 6.5–8.1)

## 2017-10-01 LAB — IRON AND TIBC
IRON: 143 ug/dL (ref 45–182)
Saturation Ratios: 28 % (ref 17.9–39.5)
TIBC: 518 ug/dL — ABNORMAL HIGH (ref 250–450)
UIBC: 375 ug/dL

## 2017-10-01 LAB — LIPID PANEL
CHOL/HDL RATIO: 5.4 ratio
Cholesterol: 199 mg/dL (ref 0–200)
HDL: 37 mg/dL — AB (ref >40)
LDL Cholesterol: 137 mg/dL — ABNORMAL HIGH (ref 0–99)
TRIGLYCERIDES: 123 mg/dL (ref <150)
VLDL: 25 mg/dL (ref 0–40)

## 2017-10-01 LAB — FERRITIN: Ferritin: 160 ng/mL (ref 24–336)

## 2017-10-02 LAB — CERULOPLASMIN: Ceruloplasmin: 24.1 mg/dL (ref 16.0–31.0)

## 2017-10-02 LAB — ANA: ANA: NEGATIVE

## 2017-10-02 LAB — HEPATITIS B SURFACE ANTIGEN: Hepatitis B Surface Ag: NEGATIVE

## 2017-10-02 LAB — HEPATITIS B SURFACE ANTIBODY,QUALITATIVE: HEP B S AB: REACTIVE

## 2017-10-02 LAB — HEPATITIS A ANTIBODY, TOTAL: HEP A TOTAL AB: NEGATIVE

## 2017-10-02 LAB — MITOCHONDRIAL ANTIBODIES: Mitochondrial M2 Ab, IgG: <20 Units (ref 0.0–20.0)

## 2017-10-02 LAB — ANTI-SMOOTH MUSCLE ANTIBODY, IGG: F-Actin IgG: 6 Units (ref 0–19)

## 2017-10-02 LAB — ALPHA-1-ANTITRYPSIN: A1 ANTITRYPSIN SER: 149 mg/dL (ref 90–200)

## 2017-10-23 ENCOUNTER — Other Ambulatory Visit: Payer: Self-pay | Admitting: Gastroenterology

## 2017-11-09 ENCOUNTER — Telehealth: Payer: Self-pay

## 2017-11-09 ENCOUNTER — Other Ambulatory Visit: Payer: Self-pay

## 2017-11-09 DIAGNOSIS — R748 Abnormal levels of other serum enzymes: Secondary | ICD-10-CM

## 2017-11-09 NOTE — Telephone Encounter (Signed)
Pt notified. Lab order has been placed for pt to go to [facility] next week to have his LFT's repeated.

## 2017-11-09 NOTE — Telephone Encounter (Signed)
-----   Message from Midge Miniumarren Wohl, MD sent at 10/18/2017  3:55 PM EDT ----- Let the patient know that his liver enzymes are higher than they had been in the past.  If he was drinking just prior to the liver test then he should have them rechecked in a month.  If he has not been drinking then we need to set him up for a liver biopsy to look for cause of his abnormal liver enzymes.

## 2017-11-26 ENCOUNTER — Other Ambulatory Visit
Admission: RE | Admit: 2017-11-26 | Discharge: 2017-11-26 | Disposition: A | Discharge status: Home / Self Care | Payer: 59 | Source: Ambulatory Visit | Attending: Gastroenterology | Admitting: Gastroenterology

## 2017-11-26 DIAGNOSIS — R748 Abnormal levels of other serum enzymes: Secondary | ICD-10-CM

## 2017-11-26 LAB — HEPATIC FUNCTION PANEL
ALT: 71 U/L — ABNORMAL HIGH (ref 0–44)
AST: 48 U/L — ABNORMAL HIGH (ref 15–41)
Albumin: 4.4 g/dL (ref 3.5–5.0)
Alkaline Phosphatase: 55 U/L (ref 38–126)
BILIRUBIN DIRECT: 0.1 mg/dL (ref 0.0–0.2)
BILIRUBIN INDIRECT: 0.6 mg/dL (ref 0.3–0.9)
BILIRUBIN TOTAL: 0.7 mg/dL (ref 0.3–1.2)
Total Protein: 7.8 g/dL (ref 6.5–8.1)

## 2017-11-27 ENCOUNTER — Telehealth: Payer: Self-pay | Admitting: Gastroenterology

## 2017-11-27 NOTE — Telephone Encounter (Signed)
Release lab result for pt to view on mychart.

## 2017-11-27 NOTE — Telephone Encounter (Signed)
Pt is calling to let Dr. Servando SnareWohl know he did his Lab work his MyChart is currently not accecable please call pt once Labs are back

## 2017-12-07 ENCOUNTER — Telehealth: Payer: Self-pay

## 2017-12-07 NOTE — Telephone Encounter (Signed)
-----   Message from Midge Miniumarren Wohl, MD sent at 12/05/2017  6:17 AM EDT ----- With the patient know that his liver enzymes continue to be elevated.  He should be off of all anti-inflammatory medication and alcohol for 3 weeks, Then have his liver enzymes checked again.  If they're still high he may need liver biopsy.

## 2017-12-07 NOTE — Telephone Encounter (Signed)
Pt notified of lab results. Pt stated he does not drink alcohol at all but does take NSAIDs. He will try his best to hold off on taking those.

## 2017-12-28 ENCOUNTER — Other Ambulatory Visit: Payer: Self-pay

## 2017-12-28 DIAGNOSIS — R748 Abnormal levels of other serum enzymes: Secondary | ICD-10-CM

## 2018-01-14 ENCOUNTER — Other Ambulatory Visit
Admission: RE | Admit: 2018-01-14 | Discharge: 2018-01-14 | Disposition: A | Discharge status: Home / Self Care | Payer: 59 | Source: Ambulatory Visit | Attending: Gastroenterology | Admitting: Gastroenterology

## 2018-01-14 DIAGNOSIS — R748 Abnormal levels of other serum enzymes: Secondary | ICD-10-CM | POA: Diagnosis not present

## 2018-01-14 LAB — HEPATIC FUNCTION PANEL
ALT: 66 U/L — AB (ref 0–44)
AST: 43 U/L — ABNORMAL HIGH (ref 15–41)
Albumin: 4.6 g/dL (ref 3.5–5.0)
Alkaline Phosphatase: 53 U/L (ref 38–126)
TOTAL PROTEIN: 7.3 g/dL (ref 6.5–8.1)
Total Bilirubin: 0.4 mg/dL (ref 0.3–1.2)

## 2018-01-24 ENCOUNTER — Telehealth: Payer: Self-pay

## 2018-01-24 NOTE — Telephone Encounter (Signed)
Pt notified of LFT results.

## 2018-01-24 NOTE — Telephone Encounter (Signed)
-----   Message from Midge Minium, MD sent at 01/14/2018  5:20 PM EDT ----- I see that the patient has been notified of his continued abnormal liver enzymes.  The patient should try to avoid his NSAIDs and recheck his liver enzymes in 2 months after not taking his NSAIDs.

## 2018-04-02 ENCOUNTER — Other Ambulatory Visit: Payer: Self-pay | Admitting: Gastroenterology

## 2018-07-25 ENCOUNTER — Ambulatory Visit (INDEPENDENT_AMBULATORY_CARE_PROVIDER_SITE_OTHER): Payer: BLUE CROSS/BLUE SHIELD | Admitting: Family Medicine

## 2018-07-25 ENCOUNTER — Encounter: Payer: Self-pay | Admitting: Family Medicine

## 2018-07-25 VITALS — BP 118/78 | HR 91 | Temp 98.2°F | Ht 73.0 in | Wt 323.2 lb

## 2018-07-25 DIAGNOSIS — J029 Acute pharyngitis, unspecified: Secondary | ICD-10-CM | POA: Insufficient documentation

## 2018-07-25 LAB — POCT RAPID STREP A (OFFICE): Rapid Strep A Screen: NEGATIVE

## 2018-07-25 NOTE — Progress Notes (Signed)
BP 118/78 (BP Location: Left Arm, Patient Position: Sitting, Cuff Size: Large)   Pulse 91   Temp 98.2 F (36.8 C) (Oral)   Ht 6\' 1"  (1.854 m)   Wt (!) 323 lb 3 oz (146.6 kg)   SpO2 96%   BMI 42.64 kg/m    CC: ST Subjective:    Patient ID: Micheal Jacobs, male    DOB: February 10, 1981, 38 y.o.   MRN: [id number]  HPI: Micheal Jacobs is a 38 y.o. male presenting on 07/25/2018 for Sore Throat (C/o sore throat and fatigue. Has also had chills. Sxs started 07/21/18. States he has been sick a few times in the last few months. )   Has felt sick 2 times over the last month - chills, body aches, overly fatigued trouble getting out of bed, never sinus congestion, ST, headache. 4d ago noted bad ST.   No fevers, ear or tooth pain, HA, PNDrainage. No cough.  No sick contacts at home.   Quit smoking 11/2016! Quit heavy drinking 11/2017.      Relevant past medical, surgical, family and social history reviewed and updated as indicated. Interim medical history since our last visit reviewed. Allergies and medications reviewed and updated. Outpatient Medications Prior to Visit  Medication Sig Dispense Refill  . pantoprazole (PROTONIX) 40 MG tablet TAKE 1 TABLET BY MOUTH EVERY DAY 30 tablet 6  . dicyclomine (BENTYL) 20 MG tablet Take 1 tablet (20 mg total) by mouth 3 (three) times daily as needed for spasms. (Patient not taking: Reported on 09/03/2017) 30 tablet 0  . naproxen sodium (ANAPROX) 220 MG tablet Take 660 mg by mouth as needed. Reported on 11/22/2015     No facility-administered medications prior to visit.      Per HPI unless specifically indicated in ROS section below Review of Systems Objective:    BP 118/78 (BP Location: Left Arm, Patient Position: Sitting, Cuff Size: Large)   Pulse 91   Temp 98.2 F (36.8 C) (Oral)   Ht 6\' 1"  (1.854 m)   Wt (!) 323 lb 3 oz (146.6 kg)   SpO2 96%   BMI 42.64 kg/m   Wt Readings from Last 3 Encounters:  07/25/18 (!) 323 lb 3 oz (146.6 kg)    09/25/17 (!) 324 lb 3.2 oz (147.1 kg)  09/03/17 (!) 324 lb (147 kg)    Physical Exam Vitals signs and nursing note reviewed.  Constitutional:      General: He is not in acute distress.    Appearance: He is well-developed.  HENT:     Head: Normocephalic and atraumatic.     Right Ear: Hearing, tympanic membrane, ear canal and external ear normal.     Left Ear: Hearing, tympanic membrane, ear canal and external ear normal.     Nose: Nose normal. No mucosal edema, congestion or rhinorrhea.     Right Sinus: No maxillary sinus tenderness or frontal sinus tenderness.     Left Sinus: No maxillary sinus tenderness or frontal sinus tenderness.     Mouth/Throat:     Mouth: Mucous membranes are moist.     Pharynx: Uvula midline. Posterior oropharyngeal erythema present. No oropharyngeal exudate.     Tonsils: No tonsillar exudate or tonsillar abscesses.  Eyes:     General: No scleral icterus.    Conjunctiva/sclera: Conjunctivae normal.     Pupils: Pupils are equal, round, and reactive to light.  Neck:     Musculoskeletal: Normal range of motion and neck supple.  Cardiovascular:  Rate and Rhythm: Normal rate and regular rhythm.     Heart sounds: Normal heart sounds. No murmur.  Pulmonary:     Effort: Pulmonary effort is normal. No respiratory distress.     Breath sounds: Normal breath sounds. No wheezing or rales.  Abdominal:     General: Bowel sounds are normal.     Palpations: Abdomen is soft.     Tenderness: There is no abdominal tenderness.     Comments: No HSM  Lymphadenopathy:     Cervical: Cervical adenopathy (R submandibular) present.  Skin:    General: Skin is warm and dry.     Findings: No rash.  Neurological:     Mental Status: He is alert.       Results for orders placed or performed in visit on 07/25/18  POCT rapid strep A  Result Value Ref Range   Rapid Strep A Screen Negative Negative   Assessment & Plan:   Problem List Items Addressed This Visit    Acute  pharyngitis - Primary    RST negative. Anticipate viral given short duration.  Supportive care reviewed as per instructions.  Update if not improving with treatment.        Other Visit Diagnoses    Sore throat       Relevant Orders   POCT rapid strep A (Completed)       No orders of the defined types were placed in this encounter.  Orders Placed This Encounter  Procedures  . POCT rapid strep A   Patient Instructions  You have acute pharyngitis, likely viral. Push fluids and plenty of rest. May use ibuprofen 600mg  with meals for throat inflammation. Salt water gargles. Honey with lemon can soothe the throat.  Suck on cold things like popsicles or warm things like herbal teas (whichever soothes the throat better). Return if fever >101.5, worsening pain, or trouble opening/closing mouth, or hoarse voice. Good to see you today, call clinic with questions.    Follow up plan: Return if symptoms worsen or fail to improve.  Micheal Boyden, MD

## 2018-07-25 NOTE — Assessment & Plan Note (Signed)
RST negative. Anticipate viral given short duration.  Supportive care reviewed as per instructions.  Update if not improving with treatment.

## 2018-07-25 NOTE — Patient Instructions (Addendum)
You have acute pharyngitis, likely viral. Push fluids and plenty of rest. May use ibuprofen 600mg  with meals for throat inflammation. Salt water gargles. Honey with lemon can soothe the throat.  Suck on cold things like popsicles or warm things like herbal teas (whichever soothes the throat better). Return if fever >101.5, worsening pain, or trouble opening/closing mouth, or hoarse voice. Good to see you today, call clinic with questions.

## 2018-11-01 IMAGING — CT CT ABD-PELV W/ CM
2 of 4 series · 16 of 46 positions shown, 18 images · IV contrast (APPLIED)
Comparison: CT abdomen pelvis 12/01/2016

CLINICAL DATA: Right upper quadrant pain.  Suspect cholecystitis.

EXAM:
CT ABDOMEN AND PELVIS WITH CONTRAST
TECHNIQUE: Multidetector CT imaging of the abdomen and pelvis was performed
using the standard protocol following bolus administration of
intravenous contrast.
CONTRAST:  <See Chart> K1MUI8-CL2 IOPAMIDOL (K1MUI8-CL2) INJECTION
76%, 125mL OMNIPAQUE IOHEXOL 300 MG/ML SOLN

[Series 2: routine abd/pel with · axial · 0.89mm/px · z∈[-1141,-656]mm · 13 of 107 slices shown, 15 images]
[im 5/107  soft-tissue]
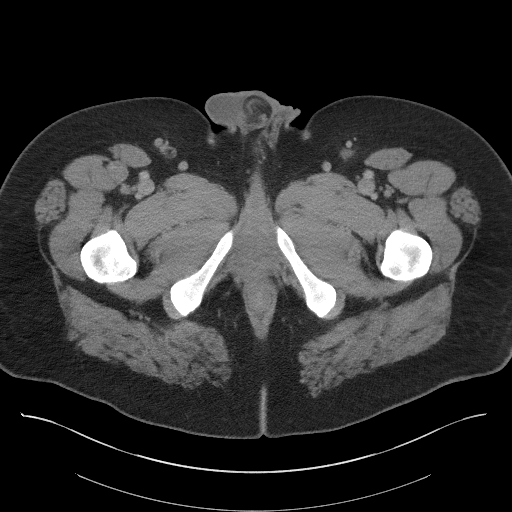
[im 5/107  bone]
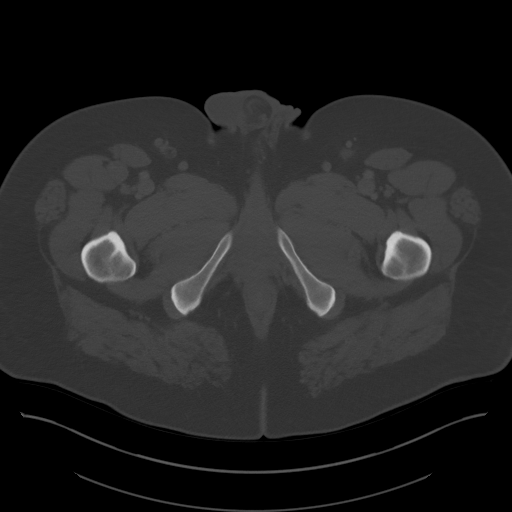
[im 14/107  soft-tissue]
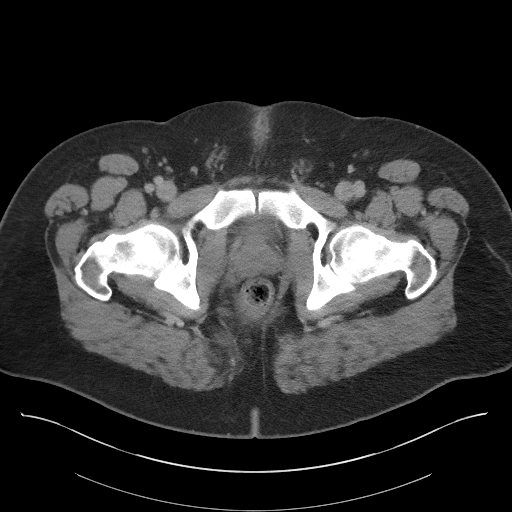
[im 24/107  soft-tissue]
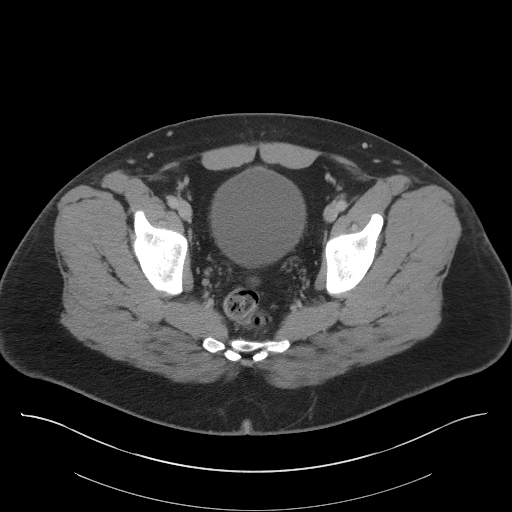
[im 28/107  soft-tissue]
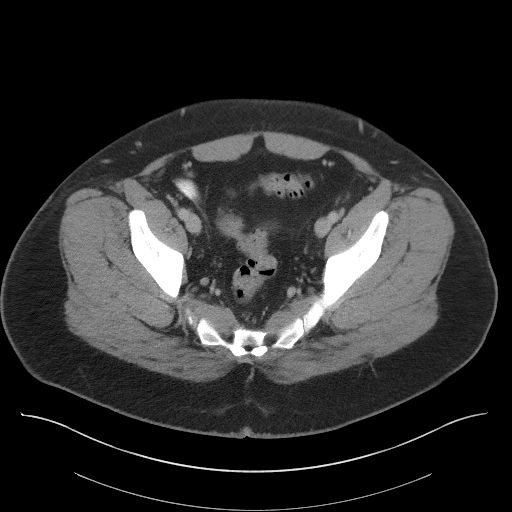
[im 37/107  soft-tissue]
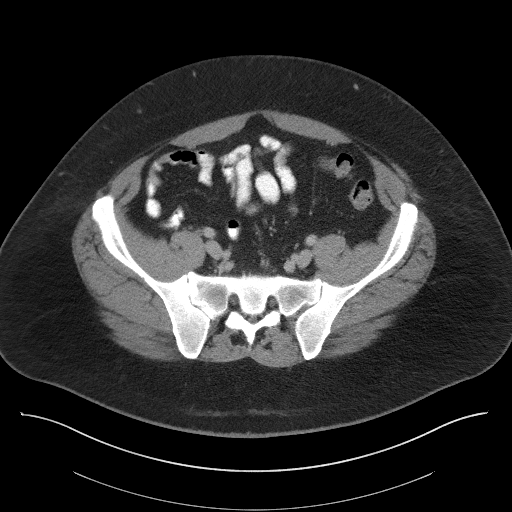
[im 47/107  soft-tissue]
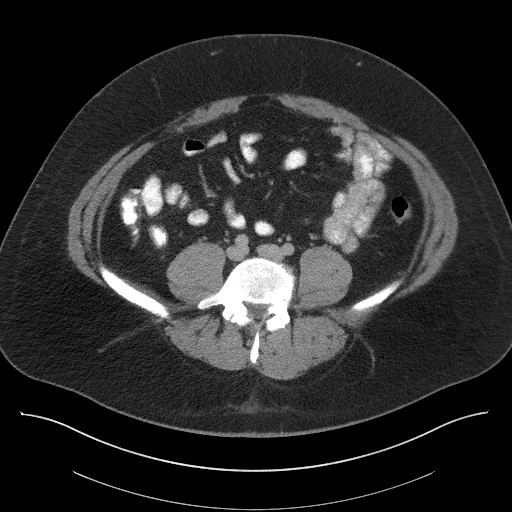
[im 56/107  soft-tissue]
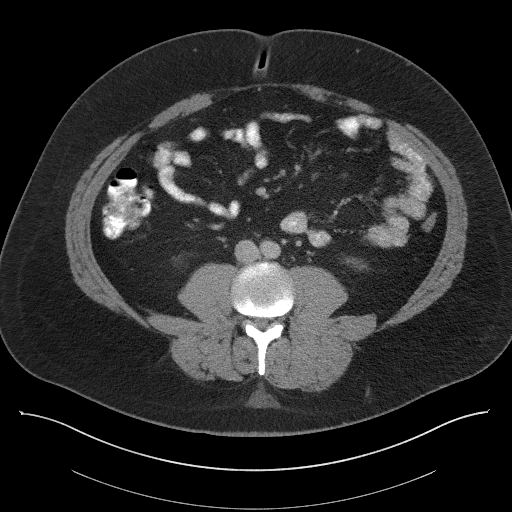
[im 60/107  soft-tissue]
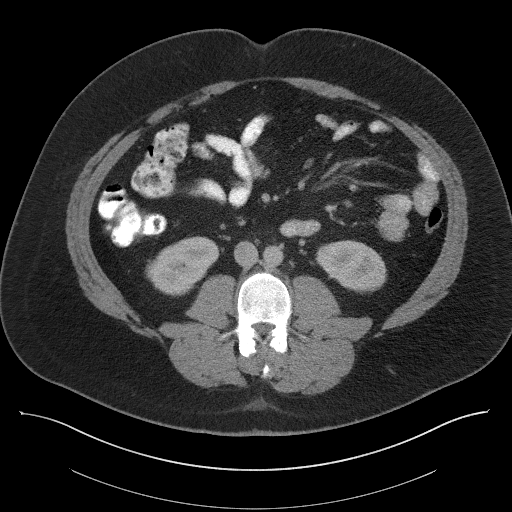
[im 70/107  soft-tissue]
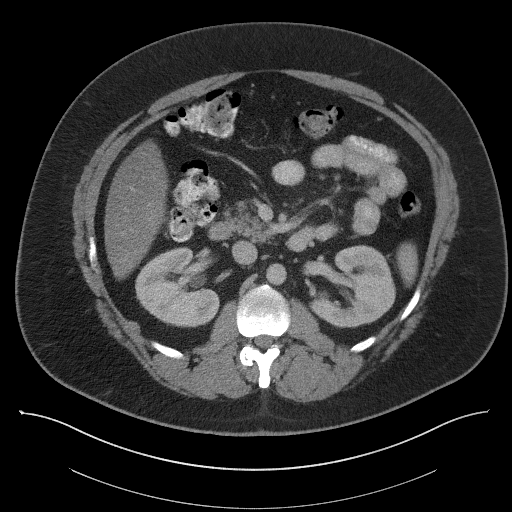
[im 70/107  bone]
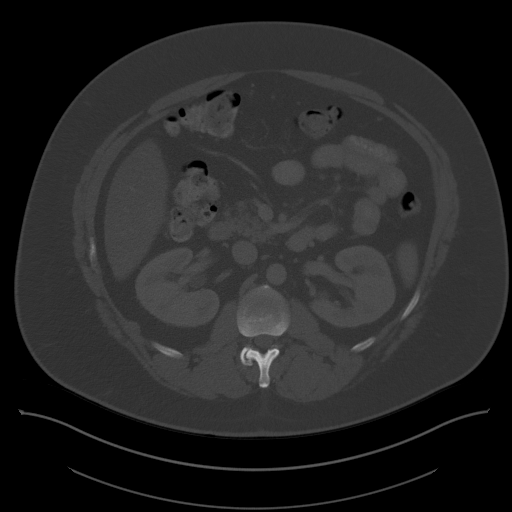
[im 79/107  soft-tissue]
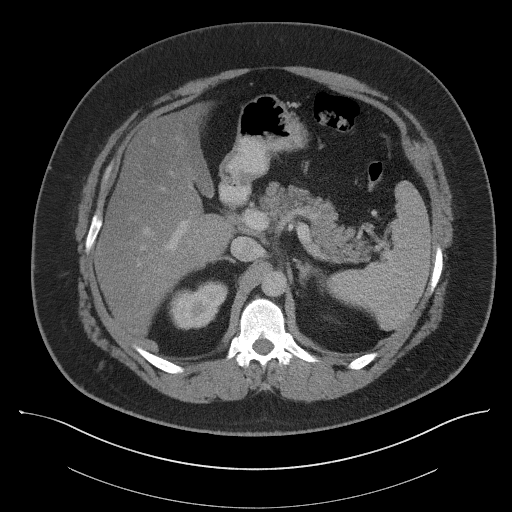
[im 83/107  soft-tissue]
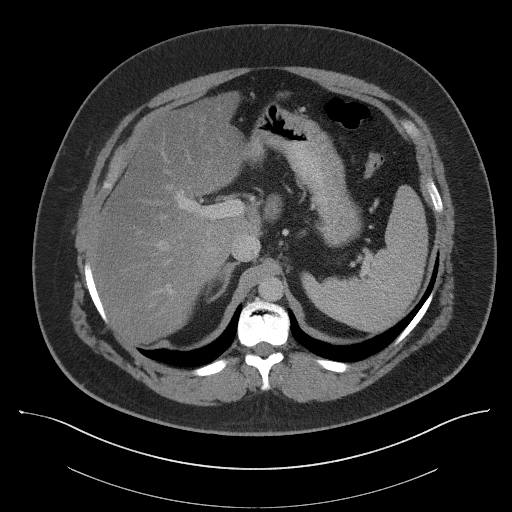
[im 93/107  soft-tissue]
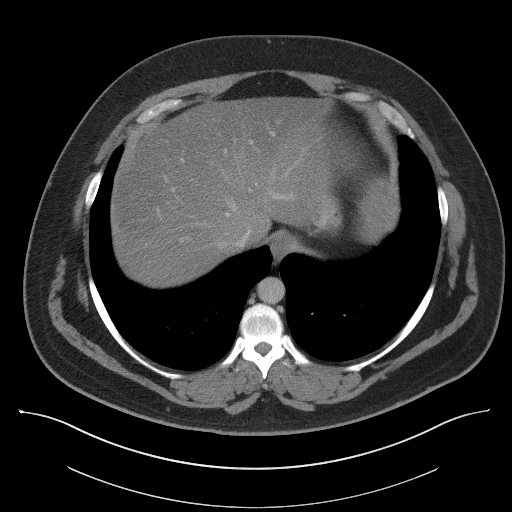
[im 102/107  soft-tissue]
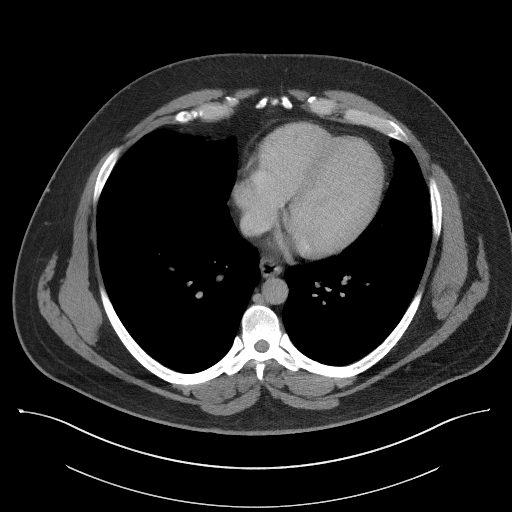

[Series 5: coronal st · coronal · 0.90mm/px · 3 of 124 slices shown]
[im 42/124  soft-tissue]
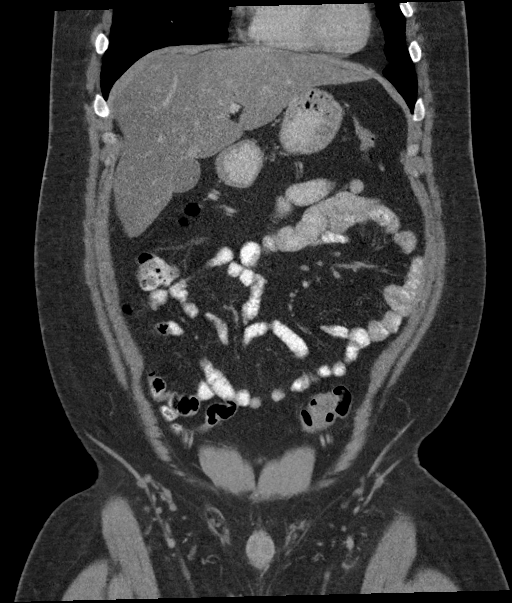
[im 55/124  soft-tissue]
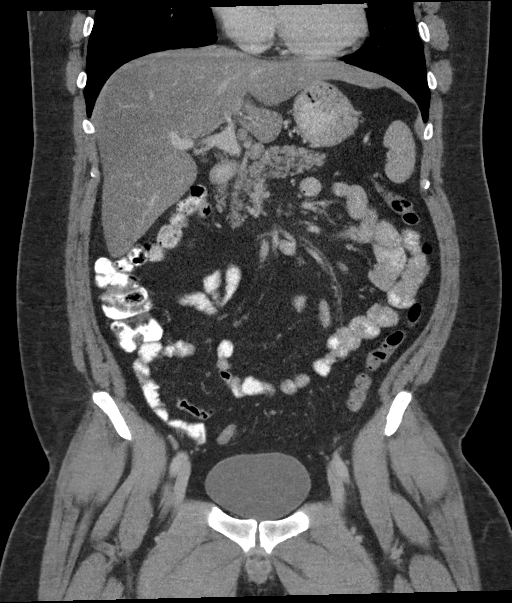
[im 69/124  soft-tissue]
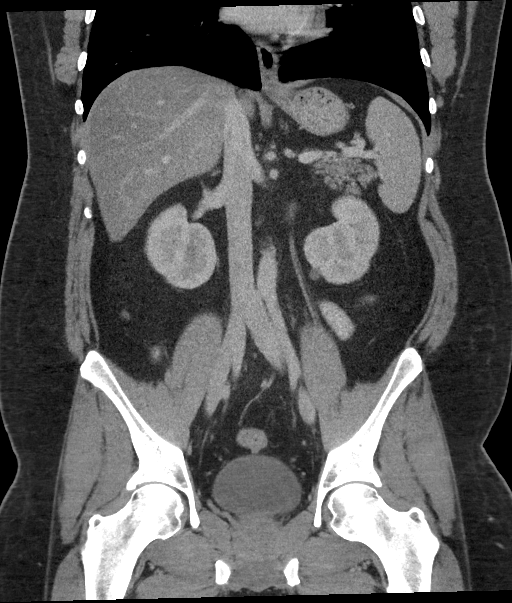

[16 of 46 positions shown; findings below may reference images not displayed]

FINDINGS: Lower chest: Lung bases clear

Hepatobiliary: Extensive fatty infiltration of liver without focal
liver lesion. Normal gallbladder without gallbladder wall thickening
or stone. No biliary dilatation.

Pancreas: Negative

Spleen: Negative

Adrenals/Urinary Tract: Negative for urinary tract calculi. No
obstruction or mass. Urinary bladder normal.

Stomach/Bowel: Negative for bowel obstruction. No bowel mass or
edema. Sigmoid diverticulosis without diverticulitis. Normal
appendix.

Vascular/Lymphatic: Negative

Reproductive: Normal prostate.

Other: Negative for free fluid or mass lesion.

Musculoskeletal: Mild spinal degenerative change without acute
skeletal abnormality.
IMPRESSION: No acute abnormality and no cause for right upper quadrant pain.
Gallbladder appears normal by CT. If there is further concern of
cholecystitis, ultrasound may be helpful

Fatty infiltration of liver without focal liver lesion.

## 2019-01-16 DIAGNOSIS — M10062 Idiopathic gout, left knee: Secondary | ICD-10-CM | POA: Diagnosis not present

## 2019-02-12 ENCOUNTER — Other Ambulatory Visit: Payer: Self-pay | Admitting: Gastroenterology

## 2019-02-17 ENCOUNTER — Telehealth: Payer: Self-pay | Admitting: Family Medicine

## 2019-02-17 ENCOUNTER — Other Ambulatory Visit: Payer: Self-pay

## 2019-02-17 DIAGNOSIS — R6889 Other general symptoms and signs: Secondary | ICD-10-CM | POA: Diagnosis not present

## 2019-02-17 DIAGNOSIS — Z20822 Contact with and (suspected) exposure to covid-19: Secondary | ICD-10-CM

## 2019-02-17 NOTE — Telephone Encounter (Signed)
Per note patient is going to [facility] testing site not to be tested

## 2019-02-17 NOTE — Telephone Encounter (Signed)
Left message to call back  

## 2019-02-17 NOTE — Telephone Encounter (Signed)
Noted  plz call pt - I'm glad he's been tested. With positive exposure and fever, recommend quarantine at home until we get test results back and then possibly for 10 days from first symptom, needs to be fever free for 24 hours and improving.  plz also place on call back list to call in 2 days for an update.

## 2019-02-17 NOTE — Telephone Encounter (Signed)
Best number [phone]  Pt called wanting to know where covid testing sites were. 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  They both had mask on sometimes   not always.  Pt stated he had fever this morning 101.  Dr Darnell Level was full today offered another provider for doxy me appointment pt declined he stated he was going to [facility] testing site now.

## 2019-02-18 LAB — NOVEL CORONAVIRUS, NAA: SARS-CoV-2, NAA: NOT DETECTED

## 2019-02-19 NOTE — Telephone Encounter (Signed)
Left message on vm for pt to call back.  Need to relay Dr. G's message.  

## 2019-02-20 NOTE — Telephone Encounter (Signed)
Left message on vm for pt to call back.  Need to relay Dr. G's message.  

## 2019-02-21 NOTE — Telephone Encounter (Signed)
Negative.

## 2019-02-21 NOTE — Telephone Encounter (Addendum)
Spoke with pt asking how he is doing.  Says he's feeling a whole lot better and is asx.  Relayed Dr. Synthia Innocent message about quarantine.  States he received results yesterday.  Pt verbalizes understanding and states his sxs started on 02/09/19 and he stayed in until yesterday.

## 2019-02-21 NOTE — Telephone Encounter (Signed)
What was his test result?

## 2019-05-03 DIAGNOSIS — U071 COVID-19: Secondary | ICD-10-CM | POA: Diagnosis not present

## 2019-05-19 ENCOUNTER — Ambulatory Visit (INDEPENDENT_AMBULATORY_CARE_PROVIDER_SITE_OTHER): Payer: BC Managed Care – PPO | Admitting: Family Medicine

## 2019-05-19 ENCOUNTER — Other Ambulatory Visit: Payer: Self-pay

## 2019-05-19 ENCOUNTER — Encounter: Payer: Self-pay | Admitting: Family Medicine

## 2019-05-19 VITALS — BP 148/98 | HR 108 | Temp 98.1°F | Ht 73.0 in | Wt 294.1 lb

## 2019-05-19 DIAGNOSIS — E6609 Other obesity due to excess calories: Secondary | ICD-10-CM

## 2019-05-19 DIAGNOSIS — R21 Rash and other nonspecific skin eruption: Secondary | ICD-10-CM | POA: Diagnosis not present

## 2019-05-19 DIAGNOSIS — Z6835 Body mass index (BMI) 35.0-35.9, adult: Secondary | ICD-10-CM

## 2019-05-19 MED ORDER — TRIAMCINOLONE ACETONIDE 0.1 % EX LOTN: 1.0000 "application " | TOPICAL_LOTION | Freq: Two times a day (BID) | CUTANEOUS | 0 refills | Status: DC

## 2019-05-19 NOTE — Patient Instructions (Signed)
Good to meet you  I have sent in a steroid lotion for your rash  I have put in a referral to dermatology, you should get a call in 1-2 weeks

## 2019-05-19 NOTE — Progress Notes (Signed)
Subjective:    Patient ID: Micheal Jacobs, male    DOB: 12-29-1980, 38 y.o.   MRN: [id number]  HPI Chief Complaint  Patient presents with  . Rash    Rash all over in patches - dry skin, not really itchy, no pain. Patches are raised and rough to touch.  Rash started on legs about 3-4 months ago   This is a 38 yo male who presents today with above cc. Rash on medial lower legs for several months. Sometimes looks worse than today (has pictures), now with patches on forearms, upper back and stomach. No itch or pain. Denies new soaps, shampoos, lotions, detergents, new meds. Some seasonal allergies.  Has not had recent labs.  Weight loss intentional. Was taking phentermine. Has gained about 15 pounds back.    Past Medical History:  Diagnosis Date  . Colitis 2000s   ?diverticulitis at age 89 yo  . Edema, peripheral 09/24/2012  . Gastritis   . GERD (gastroesophageal reflux disease)   . Heartburn   . History of chicken pox   . History of kidney stones   . Obesity   . Right low back pain 12/01/2016  . Smoker   . Stricture and stenosis of esophagus    Past Surgical History:  Procedure Laterality Date  . ELBOW FRACTURE SURGERY  1990s  . ESOPHAGOGASTRODUODENOSCOPY (EGD) WITH PROPOFOL N/A 11/25/2015   Procedure: ESOPHAGOGASTRODUODENOSCOPY (EGD) WITH PROPOFOL with dialation;  Lucilla Lame, MD; gastritis; benign biopsy  . TONSILLECTOMY  1986  . WISDOM TOOTH EXTRACTION     Family History  Problem Relation Age of Onset  . Heart disease Father 26       CHF  . Diabetes Father   . Arthritis Father   . Hypertension Mother   . Hyperlipidemia Mother   . CAD Paternal Grandfather 57       MI  . Cancer Neg Hx   . Stroke Neg Hx   . Thyroid disease Neg Hx    Social History   Tobacco Use  . Smoking status: Former Smoker    Packs/day: 0.50    Years: 18.00    Pack years: 9.00    Types: Cigarettes    Start date: 05/22/1998    Quit date: 10/20/2016    Years since quitting: 2.5  . Smokeless  tobacco: Never Used  . Tobacco comment:    Substance Use Topics  . Alcohol use: Not Currently    Comment: rarely  . Drug use: No      Review of Systems Per HPI    Objective:   Physical Exam Vitals reviewed.  Constitutional:      General: He is not in acute distress.    Appearance: Normal appearance. He is obese. He is not ill-appearing, toxic-appearing or diaphoretic.  HENT:     Head: Normocephalic and atraumatic.  Cardiovascular:     Rate and Rhythm: Normal rate.  Pulmonary:     Effort: Pulmonary effort is normal.  Skin:    General: Skin is warm and dry.     Findings: Rash present.     Comments: Bilateral medial LE with areas patchy, slightly raised erythema. Several small areas hyperpigmentation that appear chronic. Bilateral forearms with similar areas of less erythematous, raised, flaky area.   Neurological:     Mental Status: He is alert and oriented to person, place, and time.  Psychiatric:        Mood and Affect: Mood normal.  Behavior: Behavior normal.        Thought Content: Thought content normal.        Judgment: Judgment normal.       BP (!) 148/98 (BP Location: Right Arm, Patient Position: Sitting, Cuff Size: Normal) Comment: white coat syndrome  Pulse (!) 108   Temp 98.1 F (36.7 C) (Temporal)   Ht '6\' 1"'$  (1.854 m)   Wt 294 lb 1.9 oz (133.4 kg)   SpO2 95%   BMI 38.80 kg/m  Wt Readings from Last 3 Encounters:  05/19/19 294 lb 1.9 oz (133.4 kg)  07/25/18 (!) 323 lb 3 oz (146.6 kg)  09/25/17 (!) 324 lb 3.2 oz (147.1 kg)       BP Readings from Last 3 Encounters:  05/19/19 (!) 148/98  07/25/18 118/78  09/25/17 130/90    Assessment & Plan:  1. Class 2 obesity due to excess calories without serious comorbidity with body mass index (BMI) of 35.0 to 35.9 in adult - CBC with Differential - Hemoglobin A1c - Lipid Panel  2. Rash - Sed Rate (ESR) - CBC with Differential - Comprehensive metabolic panel; Future - Ambulatory referral to  Dermatology  This visit occurred during the SARS-CoV-2 public health emergency.  Safety protocols were in place, including screening questions prior to the visit, additional usage of staff PPE, and extensive cleaning of exam room while observing appropriate contact time as indicated for disinfecting solutions.    Clarene Reamer, FNP-BC  [facility]  05/19/2019 5:31 PM

## 2019-05-20 LAB — CBC WITH DIFFERENTIAL/PLATELET
Basophils Absolute: 0.2 10*3/uL — ABNORMAL HIGH (ref 0.0–0.1)
Basophils Relative: 1.5 % (ref 0.0–3.0)
Eosinophils Absolute: 0.3 10*3/uL (ref 0.0–0.7)
Eosinophils Relative: 3 % (ref 0.0–5.0)
HCT: 51.1 % (ref 39.0–52.0)
Hemoglobin: 17.4 g/dL — ABNORMAL HIGH (ref 13.0–17.0)
Lymphocytes Relative: 19.5 % (ref 12.0–46.0)
Lymphs Abs: 2.2 10*3/uL (ref 0.7–4.0)
MCHC: 34 g/dL (ref 30.0–36.0)
MCV: 92.5 fl (ref 78.0–100.0)
Monocytes Absolute: 0.7 10*3/uL (ref 0.1–1.0)
Monocytes Relative: 6.4 % (ref 3.0–12.0)
Neutro Abs: 7.7 10*3/uL (ref 1.4–7.7)
Neutrophils Relative %: 69.6 % (ref 43.0–77.0)
Platelets: 247 10*3/uL (ref 150.0–400.0)
RBC: 5.53 Mil/uL (ref 4.22–5.81)
RDW: 14.1 % (ref 11.5–15.5)
WBC: 11.1 10*3/uL — ABNORMAL HIGH (ref 4.0–10.5)

## 2019-05-20 LAB — LIPID PANEL
Cholesterol: 215 mg/dL — ABNORMAL HIGH (ref 0–200)
HDL: 42.2 mg/dL (ref >39.00)
LDL Cholesterol: 151 mg/dL — ABNORMAL HIGH (ref 0–99)
NonHDL: 172.94
Total CHOL/HDL Ratio: 5
Triglycerides: 109 mg/dL (ref 0.0–149.0)
VLDL: 21.8 mg/dL (ref 0.0–40.0)

## 2019-05-20 LAB — SEDIMENTATION RATE: Sed Rate: 11 mm/hr (ref 0–15)

## 2019-05-20 LAB — HEMOGLOBIN A1C: Hgb A1c MFr Bld: 5.5 % (ref 4.6–6.5)

## 2019-06-19 DIAGNOSIS — R21 Rash and other nonspecific skin eruption: Secondary | ICD-10-CM | POA: Diagnosis not present

## 2019-12-08 ENCOUNTER — Other Ambulatory Visit: Payer: Self-pay | Admitting: Gastroenterology

## 2020-03-15 ENCOUNTER — Other Ambulatory Visit: Payer: Self-pay | Admitting: Gastroenterology

## 2020-04-30 ENCOUNTER — Other Ambulatory Visit: Payer: Self-pay | Admitting: Gastroenterology

## 2020-05-06 NOTE — Telephone Encounter (Signed)
Patient scheduled for follow up on 07/06/20.  Needs refill called in until then.

## 2020-05-13 DIAGNOSIS — Z03818 Encounter for observation for suspected exposure to other biological agents ruled out: Secondary | ICD-10-CM | POA: Diagnosis not present

## 2020-05-13 DIAGNOSIS — Z20822 Contact with and (suspected) exposure to covid-19: Secondary | ICD-10-CM | POA: Diagnosis not present

## 2020-05-29 ENCOUNTER — Other Ambulatory Visit: Payer: Self-pay | Admitting: Gastroenterology

## 2020-06-07 ENCOUNTER — Other Ambulatory Visit: Payer: Self-pay | Admitting: Gastroenterology

## 2020-06-29 ENCOUNTER — Other Ambulatory Visit: Payer: Self-pay | Admitting: Gastroenterology

## 2020-07-06 ENCOUNTER — Other Ambulatory Visit: Payer: Self-pay

## 2020-07-06 ENCOUNTER — Ambulatory Visit: Payer: BC Managed Care – PPO | Admitting: Gastroenterology

## 2020-07-06 ENCOUNTER — Encounter: Payer: Self-pay | Admitting: Gastroenterology

## 2020-07-06 VITALS — BP 168/119 | HR 88 | Ht 73.0 in | Wt 338.6 lb

## 2020-07-06 DIAGNOSIS — R748 Abnormal levels of other serum enzymes: Secondary | ICD-10-CM

## 2020-07-06 NOTE — Progress Notes (Signed)
Primary Care Physician: Eustaquio Boyden, MD  Primary Gastroenterologist:  Dr. Midge Minium  Chief Complaint  Patient presents with  . Medication Refill    HPI: Micheal Jacobs is a 40 y.o. male here for follow-up after seeing me in the past for abnormal liver enzymes and GERD.  The patient was on pantoprazole and I saw me for abnormal liver enzymes back in 2019.  At that time the patient was recommended to stop anti-inflammatory medications and have his labs rechecked.  It does not appear that that has happened. He is now being seen because of a report that he needs a refill of his medications. The patient thinks that he had liver enzymes done but there is no record of any recent liver enzymes.  He states that he still has back pain but he is not taking as much NSAIDs as he was before.  He also denies any unexplained weight loss black stools or bloody stools.  The patient denies any abdominal pain.  Past Medical History:  Diagnosis Date  . Colitis 2000s   ?diverticulitis at age 46 yo  . Edema, peripheral 09/24/2012  . Gastritis   . GERD (gastroesophageal reflux disease)   . Heartburn   . History of chicken pox   . History of kidney stones   . Obesity   . Right low back pain 12/01/2016  . Smoker   . Stricture and stenosis of esophagus     Current Outpatient Medications  Medication Sig Dispense Refill  . pantoprazole (PROTONIX) 40 MG tablet TAKE 1 TABLET BY MOUTH EVERY DAY 30 tablet 1   No current facility-administered medications for this visit.    Allergies as of 07/06/2020 - Review Complete 07/06/2020  Allergen Reaction Noted  . Shellfish allergy Shortness Of Breath, Itching, and Anaphylaxis 07/02/2012  . Bentyl [dicyclomine hcl] Palpitations 09/03/2017    ROS:  General: Negative for anorexia, weight loss, fever, chills, fatigue, weakness. ENT: Negative for hoarseness, difficulty swallowing , nasal congestion. CV: Negative for chest pain, angina, palpitations,  dyspnea on exertion, peripheral edema.  Respiratory: Negative for dyspnea at rest, dyspnea on exertion, cough, sputum, wheezing.  GI: See history of present illness. GU:  Negative for dysuria, hematuria, urinary incontinence, urinary frequency, nocturnal urination.  Endo: Negative for unusual weight change.    Physical Examination:   BP (!) 168/119   Pulse 88   Ht 6\' 1"  (1.854 m)   Wt (!) 338 lb 9.6 oz (153.6 kg)   BMI 44.67 kg/m   General: Well-nourished, well-developed in no acute distress.  Eyes: No icterus. Conjunctivae pink. Lungs: Clear to auscultation bilaterally. Non-labored. Heart: Regular rate and rhythm, no murmurs rubs or gallops.  Abdomen: Bowel sounds are normal, nontender, nondistended, no hepatosplenomegaly or masses, no abdominal bruits or hernia , no rebound or guarding.   Extremities: No lower extremity edema. No clubbing or deformities. Neuro: Alert and oriented x 3.  Grossly intact. Skin: Warm and dry, no jaundice.   Psych: Alert and cooperative, normal mood and affect.  Labs:    Imaging Studies: No results found.  Assessment and Plan:   Micheal Jacobs is a 40 y.o. y/o male comes in today with a history of abnormal liver enzymes and will have his liver enzymes checked again today.  The patient will need a refill of his Protonix and will be given a 102-month supply with enough refills for a year.  The patient has been told to contact me if any of his issues  change.  The patient has been made a plan and agrees with it.     Midge Minium, MD. [name]    Note: This dictation was prepared with Dragon dictation along with smaller phrase technology. Any transcriptional errors that result from this process are unintentional.

## 2020-07-07 LAB — HEPATIC FUNCTION PANEL
ALT: 71 IU/L — ABNORMAL HIGH (ref 0–44)
AST: 69 IU/L — ABNORMAL HIGH (ref 0–40)
Albumin: 4.4 g/dL (ref 4.0–5.0)
Alkaline Phosphatase: 68 IU/L (ref 44–121)
Bilirubin Total: 0.2 mg/dL (ref 0.0–1.2)
Bilirubin, Direct: <0.1 mg/dL (ref 0.00–0.40)
Total Protein: 7.3 g/dL (ref 6.0–8.5)

## 2020-07-12 ENCOUNTER — Telehealth: Payer: Self-pay

## 2020-07-12 NOTE — Telephone Encounter (Signed)
Pt notified of results

## 2020-07-12 NOTE — Telephone Encounter (Signed)
-----   Message from Midge Minium, MD sent at 07/08/2020  1:01 PM EST ----- Let the patient know that his liver enzymes are higher than 2 years ago and he needs a vaccination for hepatitis A and hepatitis B if he has not received those since he was not found to be immune back in 2019.  Otherwise he should try and lose between 7 to 10% of his body mass and then have his liver enzymes checked again in 2-month.

## 2020-07-28 ENCOUNTER — Other Ambulatory Visit: Payer: Self-pay | Admitting: Gastroenterology

## 2020-12-08 ENCOUNTER — Other Ambulatory Visit: Payer: Self-pay | Admitting: Gastroenterology

## 2021-04-29 ENCOUNTER — Ambulatory Visit: Payer: BC Managed Care – PPO | Admitting: Family Medicine

## 2021-04-29 ENCOUNTER — Encounter: Payer: Self-pay | Admitting: Family Medicine

## 2021-04-29 ENCOUNTER — Other Ambulatory Visit: Payer: Self-pay

## 2021-04-29 VITALS — BP 144/110 | HR 91 | Temp 98.0°F | Ht 73.0 in | Wt 333.2 lb

## 2021-04-29 DIAGNOSIS — F17200 Nicotine dependence, unspecified, uncomplicated: Secondary | ICD-10-CM | POA: Diagnosis not present

## 2021-04-29 DIAGNOSIS — R7401 Elevation of levels of liver transaminase levels: Secondary | ICD-10-CM | POA: Diagnosis not present

## 2021-04-29 DIAGNOSIS — I10 Essential (primary) hypertension: Secondary | ICD-10-CM | POA: Diagnosis not present

## 2021-04-29 DIAGNOSIS — E785 Hyperlipidemia, unspecified: Secondary | ICD-10-CM | POA: Diagnosis not present

## 2021-04-29 LAB — POC URINALSYSI DIPSTICK (AUTOMATED)
Bilirubin, UA: NEGATIVE
Blood, UA: NEGATIVE
Glucose, UA: NEGATIVE
Ketones, UA: NEGATIVE
Leukocytes, UA: NEGATIVE
Nitrite, UA: NEGATIVE
Protein, UA: POSITIVE — AB
Spec Grav, UA: >=1.03 — AB (ref 1.010–1.025)
Urobilinogen, UA: 0.2 E.U./dL
pH, UA: 5.5 (ref 5.0–8.0)

## 2021-04-29 MED ORDER — AMLODIPINE BESYLATE 10 MG PO TABS: 10.0000 mg | ORAL_TABLET | Freq: Every day | ORAL | 6 refills | Status: DC

## 2021-04-29 NOTE — Progress Notes (Signed)
Patient ID: Micheal Jacobs, male    DOB: [birth year], 40 y.o.   MRN: [id number]  This visit was conducted in person.  BP (!) 144/110 (BP Location: Right Arm, Cuff Size: Large)   Pulse 91   Temp 98 F (36.7 C) (Temporal)   Ht 6\' 1"  (1.854 m)   Wt (!) 333 lb 3 oz (151.1 kg)   SpO2 96%   BMI 43.96 kg/m    CC: check blood pressures Subjective:   HPI: Micheal Jacobs is a 40 y.o. male presenting on 04/29/2021 for Elevated Blood Pressure (C/o recent elevated BP readings at home.  Ranges about 170s-180s/110s-120s.  Started about 1 yr ago. )   Last seen 2 yrs ago.   Over the past year, has noted elevated BP readings at home up to 170-180s/110-120s.   Caffeine - none Quit smoking 03/2021 (from 1/2 ppd) Alcohol - none Salt/sodium in diet - doesn't add salt.   Denies low blood pressure readings or symptoms of dizziness/syncope.   Denies HA, vision changes, CP/tightness, SOB, leg swelling.    Since last seen, s/p divorce, father is not doing well, mother and pt take care of him. Hospice recently involved.   Strong fmhx HTN.   Walks significantly at work.      Relevant past medical, surgical, family and social history reviewed and updated as indicated. Interim medical history since our last visit reviewed. Allergies and medications reviewed and updated. Outpatient Medications Prior to Visit  Medication Sig Dispense Refill   Cranberry-Milk Thistle (LIVER & KIDNEY CLEANSER PO) Take by mouth daily.     Multiple Vitamins-Minerals (CENTRUM MEN PO) Take 1 tablet by mouth daily.     pantoprazole (PROTONIX) 40 MG tablet TAKE 1 TABLET BY MOUTH EVERY DAY 30 tablet 6   No facility-administered medications prior to visit.     Per HPI unless specifically indicated in ROS section below Review of Systems  Objective:  BP (!) 144/110 (BP Location: Right Arm, Cuff Size: Large)   Pulse 91   Temp 98 F (36.7 C) (Temporal)   Ht 6\' 1"  (1.854 m)   Wt (!) 333 lb 3 oz (151.1 kg)    SpO2 96%   BMI 43.96 kg/m   Wt Readings from Last 3 Encounters:  04/29/21 (!) 333 lb 3 oz (151.1 kg)  07/06/20 (!) 338 lb 9.6 oz (153.6 kg)  05/19/19 294 lb 1.9 oz (133.4 kg)      Physical Exam Vitals and nursing note reviewed.  Constitutional:      Appearance: Normal appearance. He is obese. He is not ill-appearing.  Eyes:     Extraocular Movements: Extraocular movements intact.     Pupils: Pupils are equal, round, and reactive to light.  Neck:     Thyroid: No thyroid mass or thyromegaly.  Cardiovascular:     Rate and Rhythm: Normal rate and regular rhythm.     Pulses: Normal pulses.     Heart sounds: Normal heart sounds. No murmur heard. Pulmonary:     Effort: Pulmonary effort is normal. No respiratory distress.     Breath sounds: Normal breath sounds. No wheezing, rhonchi or rales.  Musculoskeletal:        General: Normal range of motion.     Cervical back: Normal range of motion and neck supple.     Right lower leg: No edema.     Left lower leg: No edema.  Skin:    General: Skin is warm and dry.  Findings: No rash.  Neurological:     Mental Status: He is alert.  Psychiatric:        Mood and Affect: Mood normal.      Results for orders placed or performed in visit on 07/06/20  Hepatic function panel  Result Value Ref Range   Total Protein 7.3 6.0 - 8.5 g/dL   Albumin 4.4 4.0 - 5.0 g/dL   Bilirubin Total 0.2 0.0 - 1.2 mg/dL   Bilirubin, Direct <1.28 0.00 - 0.40 mg/dL   Alkaline Phosphatase 68 44 - 121 IU/L   AST 69 (H) 0 - 40 IU/L   ALT 71 (H) 0 - 44 IU/L   EKG - NSR rate 90, normal axis, intervals, no hypertrophy or acute ST/T changes  Assessment & Plan:  This visit occurred during the SARS-CoV-2 public health emergency.  Safety protocols were in place, including screening questions prior to the visit, additional usage of staff PPE, and extensive cleaning of exam room while observing appropriate contact time as indicated for disinfecting solutions.    Problem List Items Addressed This Visit     Smoker    Quit 3 wks ago - congratulated, encouraged full cessation.       Obesity, morbid, BMI 40.0-49.9 (HCC)   Transaminitis   Relevant Orders   Comprehensive metabolic panel   Primary hypertension - Primary    Markedly elevated BP readings noted in office and at home, BP does improve some on recheck but with persistent elevation noted.  rec low salt/sodium diet, provided with DASH diet handout, rec regular aerobic exercise all in effort to help control BP with lifestyle modifications Start amlodipine 10mg  daily - monitoring for flushing and pedal edema side effects.  Baseline labs and EKG today.  RTC 4-6 wks f/u visit. Pt agrees with plan.       Relevant Medications   amLODipine (NORVASC) 10 MG tablet   Other Relevant Orders   EKG 12-Lead (Completed)   Lipid panel   TSH   Comprehensive metabolic panel   Microalbumin / creatinine urine ratio     Meds ordered this encounter  Medications   DISCONTD: amLODipine (NORVASC) 10 MG tablet    Sig: Take 1 tablet (10 mg total) by mouth daily.    Dispense:  30 tablet    Refill:  6   amLODipine (NORVASC) 10 MG tablet    Sig: Take 1 tablet (10 mg total) by mouth daily.    Dispense:  30 tablet    Refill:  6   Orders Placed This Encounter  Procedures   Lipid panel   TSH   Comprehensive metabolic panel   Microalbumin / creatinine urine ratio   EKG 12-Lead     Patient instructions: Your goal blood pressure is <140/90, ideally lower.  Work on low salt/sodium diet - goal <2gm (2,000mg ) per day.  Eat a diet high in fruits/vegetables and whole grains.  Look into mediterranean and DASH diet.  Goal activity is 183min/wk of moderate intensity exercise.  This can be split into 30 minute chunks.  If you are not at this level, you can start with smaller 10-15 min increments and slowly build up activity. Look at www.heart.org for more resources.  BP staying too high - start amlodipine 10mg   daily.  EKG today.  Return in 4-6 weeks for HTN f/u visit.   Follow up plan: Return in about 6 weeks (around 06/10/2021) for follow up visit.  , MD

## 2021-04-29 NOTE — Assessment & Plan Note (Signed)
Quit 3 wks ago - congratulated, encouraged full cessation.

## 2021-04-29 NOTE — Assessment & Plan Note (Signed)
Markedly elevated BP readings noted in office and at home, BP does improve some on recheck but with persistent elevation noted.  rec low salt/sodium diet, provided with DASH diet handout, rec regular aerobic exercise all in effort to help control BP with lifestyle modifications Start amlodipine 10mg  daily - monitoring for flushing and pedal edema side effects.  Baseline labs and EKG today.  RTC 4-6 wks f/u visit. Pt agrees with plan.

## 2021-04-29 NOTE — Patient Instructions (Addendum)
Your goal blood pressure is <140/90, ideally lower.  Work on low salt/sodium diet - goal <2gm (2,000mg ) per day.  Eat a diet high in fruits/vegetables and whole grains.  Look into mediterranean and DASH diet.  Goal activity is 13min/wk of moderate intensity exercise.  This can be split into 30 minute chunks.  If you are not at this level, you can start with smaller 10-15 min increments and slowly build up activity. Look at www.heart.org for more resources.  BP staying too high - start amlodipine 10mg  daily.  EKG today.  Return in 4-6 weeks for HTN f/u visit.   DASH Eating Plan DASH stands for Dietary Approaches to Stop Hypertension. The DASH eating plan is a healthy eating plan that has been shown to: Reduce high blood pressure (hypertension). Reduce your risk for type 2 diabetes, heart disease, and stroke. Help with weight loss. What are tips for following this plan? Reading food labels Check food labels for the amount of salt (sodium) per serving. Choose foods with less than 5 percent of the Daily Value of sodium. Generally, foods with less than 300 milligrams (mg) of sodium per serving fit into this eating plan. To find whole grains, look for the word "whole" as the first word in the ingredient list. Shopping Buy products labeled as "low-sodium" or "no salt added." Buy fresh foods. Avoid canned foods and pre-made or frozen meals. Cooking Avoid adding salt when cooking. Use salt-free seasonings or herbs instead of table salt or sea salt. Check with your health care provider or pharmacist before using salt substitutes. Do not fry foods. Cook foods using healthy methods such as baking, boiling, grilling, roasting, and broiling instead. Cook with heart-healthy oils, such as olive, canola, avocado, soybean, or sunflower oil. Meal planning  Eat a balanced diet that includes: 4 or more servings of fruits and 4 or more servings of vegetables each day. Try to fill one-half of your plate with  fruits and vegetables. 6-8 servings of whole grains each day. Less than 6 oz (170 g) of lean meat, poultry, or fish each day. A 3-oz (85-g) serving of meat is about the same size as a deck of cards. One egg equals 1 oz (28 g). 2-3 servings of low-fat dairy each day. One serving is 1 cup (237 mL). 1 serving of nuts, seeds, or beans 5 times each week. 2-3 servings of heart-healthy fats. Healthy fats called omega-3 fatty acids are found in foods such as walnuts, flaxseeds, fortified milks, and eggs. These fats are also found in cold-water fish, such as sardines, salmon, and mackerel. Limit how much you eat of: Canned or prepackaged foods. Food that is high in trans fat, such as some fried foods. Food that is high in saturated fat, such as fatty meat. Desserts and other sweets, sugary drinks, and other foods with added sugar. Full-fat dairy products. Do not salt foods before eating. Do not eat more than 4 egg yolks a week. Try to eat at least 2 vegetarian meals a week. Eat more home-cooked food and less restaurant, buffet, and fast food. Lifestyle When eating at a restaurant, ask that your food be prepared with less salt or no salt, if possible. If you drink alcohol: Limit how much you use to: 0-1 drink a day for women who are not pregnant. 0-2 drinks a day for men. Be aware of how much alcohol is in your drink. In the [place] one drink equals one 12 oz bottle of beer (355 mL), one 5  oz glass of wine (148 mL), or one 1 oz glass of hard liquor (44 mL). General information Avoid eating more than 2,300 mg of salt a day. If you have hypertension, you may need to reduce your sodium intake to 1,500 mg a day. Work with your health care provider to maintain a healthy body weight or to lose weight. Ask what an ideal weight is for you. Get at least 30 minutes of exercise that causes your heart to beat faster (aerobic exercise) most days of the week. Activities may include walking, swimming, or  biking. Work with your health care provider or dietitian to adjust your eating plan to your individual calorie needs. What foods should I eat? Fruits All fresh, dried, or frozen fruit. Canned fruit in natural juice (without added sugar). Vegetables Fresh or frozen vegetables (raw, steamed, roasted, or grilled). Low-sodium or reduced-sodium tomato and vegetable juice. Low-sodium or reduced-sodium tomato sauce and tomato paste. Low-sodium or reduced-sodium canned vegetables. Grains Whole-grain or whole-wheat bread. Whole-grain or whole-wheat pasta. Brown rice. Orpah Cobb. Bulgur. Whole-grain and low-sodium cereals. Pita bread. Low-fat, low-sodium crackers. Whole-wheat flour tortillas. Meats and other proteins Skinless chicken or [place]. Ground chicken or [place]. Pork with fat trimmed off. Fish and seafood. Egg whites. Dried beans, peas, or lentils. Unsalted nuts, nut butters, and seeds. Unsalted canned beans. Lean cuts of beef with fat trimmed off. Low-sodium, lean precooked or cured meat, such as sausages or meat loaves. Dairy Low-fat (1%) or fat-free (skim) milk. Reduced-fat, low-fat, or fat-free cheeses. Nonfat, low-sodium ricotta or cottage cheese. Low-fat or nonfat yogurt. Low-fat, low-sodium cheese. Fats and oils Soft margarine without trans fats. Vegetable oil. Reduced-fat, low-fat, or light mayonnaise and salad dressings (reduced-sodium). Canola, safflower, olive, avocado, soybean, and sunflower oils. Avocado. Seasonings and condiments Herbs. Spices. Seasoning mixes without salt. Other foods Unsalted popcorn and pretzels. Fat-free sweets. The items listed above may not be a complete list of foods and beverages you can eat. Contact a dietitian for more information. What foods should I avoid? Fruits Canned fruit in a light or heavy syrup. Fried fruit. Fruit in cream or butter sauce. Vegetables Creamed or fried vegetables. Vegetables in a cheese sauce. Regular canned vegetables (not  low-sodium or reduced-sodium). Regular canned tomato sauce and paste (not low-sodium or reduced-sodium). Regular tomato and vegetable juice (not low-sodium or reduced-sodium). Rosita Fire. Olives. Grains Baked goods made with fat, such as croissants, muffins, or some breads. Dry pasta or rice meal packs. Meats and other proteins Fatty cuts of meat. Ribs. Fried meat. Tomasa Blase. Bologna, salami, and other precooked or cured meats, such as sausages or meat loaves. Fat from the back of a pig (fatback). Bratwurst. Salted nuts and seeds. Canned beans with added salt. Canned or smoked fish. Whole eggs or egg yolks. Chicken or [place] with skin. Dairy Whole or 2% milk, cream, and half-and-half. Whole or full-fat cream cheese. Whole-fat or sweetened yogurt. Full-fat cheese. Nondairy creamers. Whipped toppings. Processed cheese and cheese spreads. Fats and oils Butter. Stick margarine. Lard. Shortening. Ghee. Bacon fat. Tropical oils, such as coconut, palm kernel, or palm oil. Seasonings and condiments Onion salt, garlic salt, seasoned salt, table salt, and sea salt. Worcestershire sauce. Tartar sauce. Barbecue sauce. Teriyaki sauce. Soy sauce, including reduced-sodium. Steak sauce. Canned and packaged gravies. Fish sauce. Oyster sauce. Cocktail sauce. Store-bought horseradish. Ketchup. Mustard. Meat flavorings and tenderizers. Bouillon cubes. Hot sauces. Pre-made or packaged marinades. Pre-made or packaged taco seasonings. Relishes. Regular salad dressings. Other foods Salted popcorn and pretzels. The items listed  above may not be a complete list of foods and beverages you should avoid. Contact a dietitian for more information. Where to find more information National Heart, Lung, and Blood Institute: PopSteam.is American Heart Association: www.heart.org Academy of Nutrition and Dietetics: www.eatright.org National Kidney Foundation: www.kidney.org Summary The DASH eating plan is a healthy eating plan that  has been shown to reduce high blood pressure (hypertension). It may also reduce your risk for type 2 diabetes, heart disease, and stroke. When on the DASH eating plan, aim to eat more fresh fruits and vegetables, whole grains, lean proteins, low-fat dairy, and heart-healthy fats. With the DASH eating plan, you should limit salt (sodium) intake to 2,300 mg a day. If you have hypertension, you may need to reduce your sodium intake to 1,500 mg a day. Work with your health care provider or dietitian to adjust your eating plan to your individual calorie needs. This information is not intended to replace advice given to you by your health care provider. Make sure you discuss any questions you have with your health care provider. Document Revised: 04/11/2019 Document Reviewed: 04/11/2019 Elsevier Patient Education  2022 [employer].

## 2021-04-29 NOTE — Addendum Note (Signed)
Addended by: Nanci Pina on: 04/29/2021 02:42 PM   Modules accepted: Orders

## 2021-04-30 LAB — COMPREHENSIVE METABOLIC PANEL
AG Ratio: 1.5 (calc) (ref 1.0–2.5)
ALT: 116 U/L — ABNORMAL HIGH (ref 9–46)
AST: 91 U/L — ABNORMAL HIGH (ref 10–40)
Albumin: 4.8 g/dL (ref 3.6–5.1)
Alkaline phosphatase (APISO): 66 U/L (ref 36–130)
BUN: 13 mg/dL (ref 7–25)
CO2: 24 mmol/L (ref 20–32)
Calcium: 10.4 mg/dL — ABNORMAL HIGH (ref 8.6–10.3)
Chloride: 102 mmol/L (ref 98–110)
Creat: 0.69 mg/dL (ref 0.60–1.26)
Globulin: 3.3 g/dL (calc) (ref 1.9–3.7)
Glucose, Bld: 78 mg/dL (ref 65–99)
Potassium: 4.4 mmol/L (ref 3.5–5.3)
Sodium: 140 mmol/L (ref 135–146)
Total Bilirubin: 0.6 mg/dL (ref 0.2–1.2)
Total Protein: 8.1 g/dL (ref 6.1–8.1)

## 2021-04-30 LAB — LIPID PANEL
Cholesterol: 232 mg/dL — ABNORMAL HIGH (ref <200)
HDL: 30 mg/dL — ABNORMAL LOW (ref >=40)
LDL Cholesterol (Calc): 165 mg/dL (calc) — ABNORMAL HIGH
Non-HDL Cholesterol (Calc): 202 mg/dL (calc) — ABNORMAL HIGH (ref <130)
Total CHOL/HDL Ratio: 7.7 (calc) — ABNORMAL HIGH (ref <5.0)
Triglycerides: 206 mg/dL — ABNORMAL HIGH (ref <150)

## 2021-04-30 LAB — TSH: TSH: 1.59 mIU/L (ref 0.40–4.50)

## 2021-04-30 LAB — MICROALBUMIN / CREATININE URINE RATIO
Creatinine, Urine: 183 mg/dL (ref 20–320)
Microalb Creat Ratio: 16 mcg/mg creat (ref <30)
Microalb, Ur: 2.9 mg/dL

## 2021-05-02 ENCOUNTER — Encounter: Payer: Self-pay | Admitting: Family Medicine

## 2021-05-11 ENCOUNTER — Encounter: Payer: Self-pay | Admitting: Family Medicine

## 2021-05-11 DIAGNOSIS — E785 Hyperlipidemia, unspecified: Secondary | ICD-10-CM | POA: Insufficient documentation

## 2021-05-31 DIAGNOSIS — R030 Elevated blood-pressure reading, without diagnosis of hypertension: Secondary | ICD-10-CM | POA: Diagnosis not present

## 2021-05-31 DIAGNOSIS — R0981 Nasal congestion: Secondary | ICD-10-CM | POA: Diagnosis not present

## 2021-05-31 DIAGNOSIS — U071 COVID-19: Secondary | ICD-10-CM | POA: Diagnosis not present

## 2021-06-13 ENCOUNTER — Ambulatory Visit: Payer: BC Managed Care – PPO | Admitting: Family Medicine

## 2021-06-13 ENCOUNTER — Telehealth: Payer: Self-pay

## 2021-06-13 NOTE — Telephone Encounter (Signed)
Patient notified as instructed by telephone and verbalized understanding. 

## 2021-06-13 NOTE — Addendum Note (Signed)
Addended by: Eustaquio Boyden on: 06/13/2021 10:05 AM   Modules accepted: Orders

## 2021-06-13 NOTE — Telephone Encounter (Signed)
I would recommend he cut amlodipine in half to only take 5mg  daily - and let us know if leg swelling persists on lower dose. Keep 2 wk f/u.

## 2021-06-13 NOTE — Telephone Encounter (Signed)
Patient called scheduled  for HTN follow up today. He has covid and not able to make in office. I have moved out two weeks. He states he stopped the amlodipine due to selling in his feet on 05-24-21. Home readings have been in 120/90 range without medications. Denies any symptoms of elevated blood pressure. Just wanted to make you aware of change.

## 2021-06-27 ENCOUNTER — Ambulatory Visit: Payer: BC Managed Care – PPO | Admitting: Family Medicine

## 2021-07-19 ENCOUNTER — Ambulatory Visit: Payer: BC Managed Care – PPO | Admitting: Family Medicine

## 2021-07-19 ENCOUNTER — Other Ambulatory Visit: Payer: Self-pay | Admitting: Gastroenterology

## 2021-07-31 ENCOUNTER — Other Ambulatory Visit: Payer: Self-pay | Admitting: Family Medicine

## 2021-07-31 DIAGNOSIS — Z1159 Encounter for screening for other viral diseases: Secondary | ICD-10-CM

## 2021-07-31 DIAGNOSIS — K760 Fatty (change of) liver, not elsewhere classified: Secondary | ICD-10-CM

## 2021-07-31 DIAGNOSIS — R7401 Elevation of levels of liver transaminase levels: Secondary | ICD-10-CM

## 2021-07-31 DIAGNOSIS — E785 Hyperlipidemia, unspecified: Secondary | ICD-10-CM

## 2021-08-02 ENCOUNTER — Other Ambulatory Visit: Payer: Self-pay

## 2021-08-09 ENCOUNTER — Encounter: Payer: Self-pay | Admitting: Family Medicine

## 2021-08-13 ENCOUNTER — Other Ambulatory Visit: Payer: Self-pay | Admitting: Gastroenterology

## 2021-09-14 ENCOUNTER — Other Ambulatory Visit: Payer: Self-pay | Admitting: Gastroenterology

## 2021-10-03 ENCOUNTER — Encounter: Payer: Self-pay | Admitting: Family Medicine

## 2021-10-15 ENCOUNTER — Other Ambulatory Visit: Payer: Self-pay | Admitting: Gastroenterology

## 2021-10-18 MED ORDER — PANTOPRAZOLE SODIUM 40 MG PO TBEC: 40.0000 mg | DELAYED_RELEASE_TABLET | Freq: Every day | ORAL | 1 refills | Status: DC

## 2021-10-18 NOTE — Addendum Note (Signed)
Addended by: Roena Malady on: 10/18/2021 06:10 PM   Modules accepted: Orders

## 2021-11-16 ENCOUNTER — Encounter: Payer: Self-pay | Admitting: Family Medicine

## 2021-11-28 ENCOUNTER — Telehealth: Payer: Self-pay

## 2021-11-28 ENCOUNTER — Encounter: Payer: Self-pay | Admitting: Family Medicine

## 2021-11-28 NOTE — Telephone Encounter (Signed)
Pt was scheduled today for CPE but r/s to 02/20/22 and lab visit on 02/13/22.  Dr. Reece Agar wants to see pt earlier. Ok to schedule CPE in earliest 30 min slot, then r/s lab visit 1 wk prior.  Lvm asking pt to call back.  Plz r/s vists above.

## 2021-11-29 NOTE — Telephone Encounter (Signed)
Looks like pt r/s CPE on 12/20/21 at 11:00 and lab visit on 12/13/21 at 7:30.

## 2021-12-13 ENCOUNTER — Other Ambulatory Visit (INDEPENDENT_AMBULATORY_CARE_PROVIDER_SITE_OTHER): Payer: BC Managed Care – PPO

## 2021-12-13 DIAGNOSIS — Z1159 Encounter for screening for other viral diseases: Secondary | ICD-10-CM

## 2021-12-13 DIAGNOSIS — E785 Hyperlipidemia, unspecified: Secondary | ICD-10-CM

## 2021-12-13 DIAGNOSIS — R7401 Elevation of levels of liver transaminase levels: Secondary | ICD-10-CM

## 2021-12-13 LAB — LIPID PANEL
Cholesterol: 249 mg/dL — ABNORMAL HIGH (ref 0–200)
HDL: 24 mg/dL — ABNORMAL LOW (ref >39.00)
LDL Cholesterol: 185 mg/dL — ABNORMAL HIGH (ref 0–99)
NonHDL: 224.5
Total CHOL/HDL Ratio: 10
Triglycerides: 200 mg/dL — ABNORMAL HIGH (ref 0.0–149.0)
VLDL: 40 mg/dL (ref 0.0–40.0)

## 2021-12-13 LAB — CBC WITH DIFFERENTIAL/PLATELET
Basophils Absolute: 0 10*3/uL (ref 0.0–0.1)
Basophils Relative: 0.5 % (ref 0.0–3.0)
Eosinophils Absolute: 0.3 10*3/uL (ref 0.0–0.7)
Eosinophils Relative: 4.9 % (ref 0.0–5.0)
HCT: 48.2 % (ref 39.0–52.0)
Hemoglobin: 16.1 g/dL (ref 13.0–17.0)
Lymphocytes Relative: 29.3 % (ref 12.0–46.0)
Lymphs Abs: 1.8 10*3/uL (ref 0.7–4.0)
MCHC: 33.5 g/dL (ref 30.0–36.0)
MCV: 91.2 fl (ref 78.0–100.0)
Monocytes Absolute: 0.4 10*3/uL (ref 0.1–1.0)
Monocytes Relative: 6.7 % (ref 3.0–12.0)
Neutro Abs: 3.7 10*3/uL (ref 1.4–7.7)
Neutrophils Relative %: 58.6 % (ref 43.0–77.0)
Platelets: 248 10*3/uL (ref 150.0–400.0)
RBC: 5.28 Mil/uL (ref 4.22–5.81)
RDW: 15 % (ref 11.5–15.5)
WBC: 6.3 10*3/uL (ref 4.0–10.5)

## 2021-12-13 LAB — COMPREHENSIVE METABOLIC PANEL
ALT: 113 U/L — ABNORMAL HIGH (ref 0–53)
AST: 68 U/L — ABNORMAL HIGH (ref 0–37)
Albumin: 4.5 g/dL (ref 3.5–5.2)
Alkaline Phosphatase: 75 U/L (ref 39–117)
BUN: 8 mg/dL (ref 6–23)
CO2: 28 mEq/L (ref 19–32)
Calcium: 9.6 mg/dL (ref 8.4–10.5)
Chloride: 104 mEq/L (ref 96–112)
Creatinine, Ser: 0.71 mg/dL (ref 0.40–1.50)
GFR: 114.97 mL/min (ref >60.00)
Glucose, Bld: 96 mg/dL (ref 70–99)
Potassium: 4 mEq/L (ref 3.5–5.1)
Sodium: 142 mEq/L (ref 135–145)
Total Bilirubin: 0.4 mg/dL (ref 0.2–1.2)
Total Protein: 7.2 g/dL (ref 6.0–8.3)

## 2021-12-13 LAB — IBC PANEL
Iron: 112 ug/dL (ref 42–165)
Saturation Ratios: 21 % (ref 20.0–50.0)
TIBC: 533.4 ug/dL — ABNORMAL HIGH (ref 250.0–450.0)
Transferrin: 381 mg/dL — ABNORMAL HIGH (ref 212.0–360.0)

## 2021-12-13 LAB — FERRITIN: Ferritin: 194.9 ng/mL (ref 22.0–322.0)

## 2021-12-14 LAB — HEPATITIS PANEL, ACUTE
Hep A IgM: NONREACTIVE
Hep B C IgM: NONREACTIVE
Hepatitis B Surface Ag: NONREACTIVE
Hepatitis C Ab: NONREACTIVE

## 2021-12-20 ENCOUNTER — Ambulatory Visit (INDEPENDENT_AMBULATORY_CARE_PROVIDER_SITE_OTHER): Payer: BC Managed Care – PPO | Admitting: Family Medicine

## 2021-12-20 ENCOUNTER — Encounter: Payer: Self-pay | Admitting: Family Medicine

## 2021-12-20 VITALS — BP 126/82 | HR 94 | Temp 98.0°F | Ht 71.5 in | Wt 323.5 lb

## 2021-12-20 DIAGNOSIS — K760 Fatty (change of) liver, not elsewhere classified: Secondary | ICD-10-CM

## 2021-12-20 DIAGNOSIS — F17200 Nicotine dependence, unspecified, uncomplicated: Secondary | ICD-10-CM | POA: Diagnosis not present

## 2021-12-20 DIAGNOSIS — K219 Gastro-esophageal reflux disease without esophagitis: Secondary | ICD-10-CM

## 2021-12-20 DIAGNOSIS — Z0000 Encounter for general adult medical examination without abnormal findings: Secondary | ICD-10-CM | POA: Diagnosis not present

## 2021-12-20 DIAGNOSIS — E785 Hyperlipidemia, unspecified: Secondary | ICD-10-CM

## 2021-12-20 DIAGNOSIS — Z8249 Family history of ischemic heart disease and other diseases of the circulatory system: Secondary | ICD-10-CM

## 2021-12-20 DIAGNOSIS — I10 Essential (primary) hypertension: Secondary | ICD-10-CM

## 2021-12-20 MED ORDER — AMLODIPINE BESYLATE 10 MG PO TABS: 10.0000 mg | ORAL_TABLET | Freq: Every day | ORAL | 3 refills | Status: DC

## 2021-12-20 NOTE — Assessment & Plan Note (Signed)
Chronic, poor control. Reviewed diet choices to improve cholesterol control namely LDL. RTC 6 months repeat FLP, LP(a).  The 10-year ASCVD risk score (Arnett DK, et al., 2019) is: 17.8%   Values used to calculate the score:     Age: 41 years     Sex: Male     Is Non-Hispanic African American: No     Diabetic: No     Tobacco smoker: Yes     Systolic Blood Pressure: 126 mmHg     Is BP treated: Yes     HDL Cholesterol: 24 mg/dL     Total Cholesterol: 249 mg/dL

## 2021-12-20 NOTE — Assessment & Plan Note (Signed)
Chronic, stable on amlodipine 10mg  daily. He doesn't feel CCB worsened GERD.

## 2021-12-20 NOTE — Assessment & Plan Note (Signed)
Followed by GI s/p EGD - continues omeprazole 40mg  daily

## 2021-12-20 NOTE — Assessment & Plan Note (Signed)
Preventative protocols reviewed and updated unless pt declined. Discussed healthy diet and lifestyle.  

## 2021-12-20 NOTE — Assessment & Plan Note (Addendum)
Anticipate fatty liver related. Encouraged weight loss, cholesterol control.  Diffuse extensive fatty liver on imaging 2019

## 2021-12-20 NOTE — Patient Instructions (Addendum)
Continue regular walking routine. Work on increased vegetables.  Return in 6 months for fasting lab visit only  Return in 1 year for next physical.   Health Maintenance, Male Adopting a healthy lifestyle and getting preventive care are important in promoting health and wellness. Ask your health care provider about: The right schedule for you to have regular tests and exams. Things you can do on your own to prevent diseases and keep yourself healthy. What should I know about diet, weight, and exercise? Eat a healthy diet  Eat a diet that includes plenty of vegetables, fruits, low-fat dairy products, and lean protein. Do not eat a lot of foods that are high in solid fats, added sugars, or sodium. Maintain a healthy weight Body mass index (BMI) is a measurement that can be used to identify possible weight problems. It estimates body fat based on height and weight. Your health care provider can help determine your BMI and help you achieve or maintain a healthy weight. Get regular exercise Get regular exercise. This is one of the most important things you can do for your health. Most adults should: Exercise for at least 150 minutes each week. The exercise should increase your heart rate and make you sweat (moderate-intensity exercise). Do strengthening exercises at least twice a week. This is in addition to the moderate-intensity exercise. Spend less time sitting. Even light physical activity can be beneficial. Watch cholesterol and blood lipids Have your blood tested for lipids and cholesterol at 40 years of age, then have this test every 5 years. You may need to have your cholesterol levels checked more often if: Your lipid or cholesterol levels are high. You are older than 40 years of age. You are at high risk for heart disease. What should I know about cancer screening? Many types of cancers can be detected early and may often be prevented. Depending on your health history and family  history, you may need to have cancer screening at various ages. This may include screening for: Colorectal cancer. Prostate cancer. Skin cancer. Lung cancer. What should I know about heart disease, diabetes, and high blood pressure? Blood pressure and heart disease High blood pressure causes heart disease and increases the risk of stroke. This is more likely to develop in people who have high blood pressure readings or are overweight. Talk with your health care provider about your target blood pressure readings. Have your blood pressure checked: Every 3-5 years if you are 28-78 years of age. Every year if you are 53 years old or older. If you are between the ages of 35 and 74 and are a current or former smoker, ask your health care provider if you should have a one-time screening for abdominal aortic aneurysm (AAA). Diabetes Have regular diabetes screenings. This checks your fasting blood sugar level. Have the screening done: Once every three years after age 61 if you are at a normal weight and have a low risk for diabetes. More often and at a younger age if you are overweight or have a high risk for diabetes. What should I know about preventing infection? Hepatitis B If you have a higher risk for hepatitis B, you should be screened for this virus. Talk with your health care provider to find out if you are at risk for hepatitis B infection. Hepatitis C Blood testing is recommended for: Everyone born from [birth year] through 1964. Anyone with known risk factors for hepatitis C. Sexually transmitted infections (STIs) You should be screened each year for  STIs, including gonorrhea and chlamydia, if: You are sexually active and are younger than 40 years of age. You are older than 40 years of age and your health care provider tells you that you are at risk for this type of infection. Your sexual activity has changed since you were last screened, and you are at increased risk for chlamydia or  gonorrhea. Ask your health care provider if you are at risk. Ask your health care provider about whether you are at high risk for HIV. Your health care provider may recommend a prescription medicine to help prevent HIV infection. If you choose to take medicine to prevent HIV, you should first get tested for HIV. You should then be tested every 3 months for as long as you are taking the medicine. Follow these instructions at home: Alcohol use Do not drink alcohol if your health care provider tells you not to drink. If you drink alcohol: Limit how much you have to 0-2 drinks a day. Know how much alcohol is in your drink. In the [place] one drink equals one 12 oz bottle of beer (355 mL), one 5 oz glass of wine (148 mL), or one 1 oz glass of hard liquor (44 mL). Lifestyle Do not use any products that contain nicotine or tobacco. These products include cigarettes, chewing tobacco, and vaping devices, such as e-cigarettes. If you need help quitting, ask your health care provider. Do not use street drugs. Do not share needles. Ask your health care provider for help if you need support or information about quitting drugs. General instructions Schedule regular health, dental, and eye exams. Stay current with your vaccines. Tell your health care provider if: You often feel depressed. You have ever been abused or do not feel safe at home. Summary Adopting a healthy lifestyle and getting preventive care are important in promoting health and wellness. Follow your health care provider's instructions about healthy diet, exercising, and getting tested or screened for diseases. Follow your health care provider's instructions on monitoring your cholesterol and blood pressure. This information is not intended to replace advice given to you by your health care provider. Make sure you discuss any questions you have with your health care provider. Document Revised: 09/27/2020 Document Reviewed: 09/27/2020 Elsevier  Patient Education  [employer].

## 2021-12-20 NOTE — Progress Notes (Signed)
Patient ID: Micheal Jacobs, male    DOB: [birth year], 41 y.o.   MRN: [id number]  This visit was conducted in person.  BP 126/82   Pulse 94   Temp 98 F (36.7 C) (Temporal)   Ht 5' 11.5" (1.816 m)   Wt (!) 323 lb 8 oz (146.7 kg)   SpO2 96%   BMI 44.49 kg/m    CC: CPE Subjective:   HPI: Micheal Jacobs is a 41 y.o. male presenting on 12/20/2021 for Annual Exam   Father passed away recently. Also had some friends that recently passed away from COVID.   HTN - well controlled on daily amlodipine 10mg  without ankle swelling effect.  GERD - on PPI daily for at least 7 yrs, with breakthrough symptoms if dose missed. This is managed by GI - upcoming appt this month.  ESOPHAGOGASTRODUODENOSCOPY (EGD) WITH PROPOFOL N/A 11/25/2015 with dialation;  01/26/2016, MD; gastritis; benign biopsy  No recent kidney stone.   Preventative: Colon cancer screening - not due Prostate cancer screening - not due Lung cancer screening - not due  Flu shot - declined COVID shot - didn't receive Tdap 2014 Pneumonia shot - not due Shingrix - not due Advanced directive discussion -  Seat belt use discussed Sunscreen use discussed. No changing moles on skin. Smoking - 3-4 cig/day, prior 1 ppd for ~10 yrs Alcohol - backed down significant, quit 30 days ago.  Dentist - q6 mo Eye exam - yearly   Caffeine: none Lives with wife, 3 children.  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      Relevant past medical, surgical, family and social history reviewed and updated as indicated. Interim medical history since our last visit reviewed. Allergies and medications reviewed and updated. Outpatient Medications Prior to Visit  Medication Sig Dispense Refill   Cranberry-Milk Thistle (LIVER & KIDNEY CLEANSER PO) Take by mouth daily.     Multiple  Vitamins-Minerals (CENTRUM MEN PO) Take 1 tablet by mouth daily.     pantoprazole (PROTONIX) 40 MG tablet Take 1 tablet (40 mg total) by mouth daily. 90 tablet 1   amLODipine (NORVASC) 10 MG tablet Take 0.5 tablets (5 mg total) by mouth daily.     No facility-administered medications prior to visit.     Per HPI unless specifically indicated in ROS section below Review of Systems  Constitutional:  Negative for activity change, appetite change, chills, fatigue, fever and unexpected weight change.  HENT:  Negative for hearing loss.   Eyes:  Negative for visual disturbance.  Respiratory:  Negative for cough, chest tightness, shortness of breath and wheezing.   Cardiovascular:  Negative for chest pain, palpitations and leg swelling.  Gastrointestinal:  Negative for abdominal distention, abdominal pain, blood in stool, constipation, diarrhea, nausea and vomiting.  Genitourinary:  Negative for difficulty urinating and hematuria.  Musculoskeletal:  Negative for arthralgias, myalgias and neck pain.  Skin:  Negative for rash.  Neurological:  Positive for headaches (occ - stress related). Negative for dizziness, seizures and syncope.  Hematological:  Negative for adenopathy. Does not bruise/bleed easily.  Psychiatric/Behavioral:  Negative for dysphoric mood. The patient is not nervous/anxious.     Objective:  BP 126/82   Pulse 94   Temp 98 F (36.7 C) (Temporal)   Ht 5' 11.5" (1.816 m)   Wt (!) 323 lb  8 oz (146.7 kg)   SpO2 96%   BMI 44.49 kg/m   Wt Readings from Last 3 Encounters:  12/20/21 (!) 323 lb 8 oz (146.7 kg)  04/29/21 (!) 333 lb 3 oz (151.1 kg)  07/06/20 (!) 338 lb 9.6 oz (153.6 kg)      Physical Exam Vitals and nursing note reviewed.  Constitutional:      General: He is not in acute distress.    Appearance: Normal appearance. He is well-developed. He is obese. He is not ill-appearing.  HENT:     Head: Normocephalic and atraumatic.     Right Ear: Hearing, tympanic  membrane, ear canal and external ear normal.     Left Ear: Hearing, tympanic membrane, ear canal and external ear normal.  Eyes:     General: No scleral icterus.    Extraocular Movements: Extraocular movements intact.     Conjunctiva/sclera: Conjunctivae normal.     Pupils: Pupils are equal, round, and reactive to light.  Neck:     Thyroid: No thyroid mass or thyromegaly.  Cardiovascular:     Rate and Rhythm: Normal rate and regular rhythm.     Pulses: Normal pulses.          Radial pulses are 2+ on the right side and 2+ on the left side.     Heart sounds: Normal heart sounds. No murmur heard. Pulmonary:     Effort: Pulmonary effort is normal. No respiratory distress.     Breath sounds: Normal breath sounds. No wheezing, rhonchi or rales.  Abdominal:     General: Bowel sounds are normal. There is no distension.     Palpations: Abdomen is soft. There is no mass.     Tenderness: There is no abdominal tenderness. There is no guarding or rebound.     Hernia: No hernia is present.  Musculoskeletal:        General: Normal range of motion.     Cervical back: Normal range of motion and neck supple.     Right lower leg: No edema.     Left lower leg: No edema.  Lymphadenopathy:     Cervical: No cervical adenopathy.  Skin:    General: Skin is warm and dry.     Findings: No rash.  Neurological:     General: No focal deficit present.     Mental Status: He is alert and oriented to person, place, and time.  Psychiatric:        Mood and Affect: Mood normal.        Behavior: Behavior normal.        Thought Content: Thought content normal.        Judgment: Judgment normal.       Results for orders placed or performed in visit on 12/13/21  CBC with Differential/Platelet  Result Value Ref Range   WBC 6.3 4.0 - 10.5 K/uL   RBC 5.28 4.22 - 5.81 Mil/uL   Hemoglobin 16.1 13.0 - 17.0 g/dL   HCT 59.5 63.8 - 75.6 %   MCV 91.2 78.0 - 100.0 fl   MCHC 33.5 30.0 - 36.0 g/dL   RDW 43.3 29.5 -  18.8 %   Platelets 248.0 150.0 - 400.0 K/uL   Neutrophils Relative % 58.6 43.0 - 77.0 %   Lymphocytes Relative 29.3 12.0 - 46.0 %   Monocytes Relative 6.7 3.0 - 12.0 %   Eosinophils Relative 4.9 0.0 - 5.0 %   Basophils Relative 0.5 0.0 - 3.0 %  Neutro Abs 3.7 1.4 - 7.7 K/uL   Lymphs Abs 1.8 0.7 - 4.0 K/uL   Monocytes Absolute 0.4 0.1 - 1.0 K/uL   Eosinophils Absolute 0.3 0.0 - 0.7 K/uL   Basophils Absolute 0.0 0.0 - 0.1 K/uL  Ferritin  Result Value Ref Range   Ferritin 194.9 22.0 - 322.0 ng/mL  IBC panel  Result Value Ref Range   Iron 112 42 - 165 ug/dL   Transferrin 676.7 (H) 212.0 - 360.0 mg/dL   Saturation Ratios 20.9 20.0 - 50.0 %   TIBC 533.4 (H) 250.0 - 450.0 mcg/dL  Hepatitis panel, acute  Result Value Ref Range   Hep A IgM NON-REACTIVE NON-REACTIVE   Hepatitis B Surface Ag NON-REACTIVE NON-REACTIVE   Hep B C IgM NON-REACTIVE NON-REACTIVE   Hepatitis C Ab NON-REACTIVE NON-REACTIVE  Lipid panel  Result Value Ref Range   Cholesterol 249 (H) 0 - 200 mg/dL   Triglycerides 470.9 (H) 0.0 - 149.0 mg/dL   HDL 62.83 (L) >66.29 mg/dL   VLDL 47.6 0.0 - 54.6 mg/dL   LDL Cholesterol 503 (H) 0 - 99 mg/dL   Total CHOL/HDL Ratio 10    NonHDL 224.50   Comprehensive metabolic panel  Result Value Ref Range   Sodium 142 135 - 145 mEq/L   Potassium 4.0 3.5 - 5.1 mEq/L   Chloride 104 96 - 112 mEq/L   CO2 28 19 - 32 mEq/L   Glucose, Bld 96 70 - 99 mg/dL   BUN 8 6 - 23 mg/dL   Creatinine, Ser 5.46 0.40 - 1.50 mg/dL   Total Bilirubin 0.4 0.2 - 1.2 mg/dL   Alkaline Phosphatase 75 39 - 117 U/L   AST 68 (H) 0 - 37 U/L   ALT 113 (H) 0 - 53 U/L   Total Protein 7.2 6.0 - 8.3 g/dL   Albumin 4.5 3.5 - 5.2 g/dL   GFR 568.12 >75.17 mL/min   Calcium 9.6 8.4 - 10.5 mg/dL    Assessment & Plan:   Problem List Items Addressed This Visit     Health maintenance examination - Primary (Chronic)    Preventative protocols reviewed and updated unless pt declined. Discussed healthy diet and  lifestyle.       GERD (gastroesophageal reflux disease)    Followed by GI s/p EGD - continues omeprazole 40mg  daily      Smoker    Encouraged ongoing cessation attempts. He is down to a few cigarettes per day.       Obesity, morbid, BMI 40.0-49.9 (HCC)    Encouraged healthy diet and lifestyle changes to affect sustainable weight loss.       NAFLD (nonalcoholic fatty liver disease)    Anticipate fatty liver related. Encouraged weight loss, cholesterol control.  Diffuse extensive fatty liver on imaging 2019      Relevant Orders   Comprehensive metabolic panel   Primary hypertension    Chronic, stable on amlodipine 10mg  daily. He doesn't feel CCB worsened GERD.       Relevant Medications   amLODipine (NORVASC) 10 MG tablet   Dyslipidemia    Chronic, poor control. Reviewed diet choices to improve cholesterol control namely LDL. RTC 6 months repeat FLP, LP(a).  The 10-year ASCVD risk score (Arnett DK, et al., 2019) is: 17.8%   Values used to calculate the score:     Age: 38 years     Sex: Male     Is Non-Hispanic African American: No     Diabetic: No  Tobacco smoker: Yes     Systolic Blood Pressure: 126 mmHg     Is BP treated: Yes     HDL Cholesterol: 24 mg/dL     Total Cholesterol: 249 mg/dL       Relevant Orders   Comprehensive metabolic panel   Lipid panel   Lipoprotein A (LPA)   Apolipoprotein B   Family history of heart disease   Relevant Orders   Lipoprotein A (LPA)   Apolipoprotein B     Meds ordered this encounter  Medications   amLODipine (NORVASC) 10 MG tablet    Sig: Take 1 tablet (10 mg total) by mouth daily.    Dispense:  90 tablet    Refill:  3   Orders Placed This Encounter  Procedures   Comprehensive metabolic panel    Standing Status:   Future    Standing Expiration Date:   12/21/2022   Lipid panel    Standing Status:   Future    Standing Expiration Date:   12/21/2022   Lipoprotein A (LPA)    Standing Status:   Future    Standing  Expiration Date:   12/21/2022   Apolipoprotein B    Standing Status:   Future    Standing Expiration Date:   12/21/2022     Patient instrutions: Continue regular walking routine. Work on increased vegetables.  Return in 6 months for fasting lab visit only  Return in 1 year for next physical.   Follow up plan: Return in about 1 year (around 12/21/2022) for annual exam, prior fasting for blood work.  Eustaquio Boyden, MD

## 2021-12-20 NOTE — Assessment & Plan Note (Signed)
Encouraged ongoing cessation attempts. He is down to a few cigarettes per day.

## 2021-12-20 NOTE — Assessment & Plan Note (Addendum)
Encouraged healthy diet and lifestyle changes to affect sustainable weight loss.  

## 2022-02-13 ENCOUNTER — Other Ambulatory Visit: Payer: BC Managed Care – PPO

## 2022-02-20 ENCOUNTER — Encounter: Payer: Self-pay | Admitting: Gastroenterology

## 2022-02-20 ENCOUNTER — Encounter: Payer: Self-pay | Admitting: Family Medicine

## 2022-02-20 ENCOUNTER — Ambulatory Visit: Payer: BC Managed Care – PPO | Admitting: Gastroenterology

## 2022-02-20 DIAGNOSIS — K219 Gastro-esophageal reflux disease without esophagitis: Secondary | ICD-10-CM | POA: Diagnosis not present

## 2022-02-20 MED ORDER — PANTOPRAZOLE SODIUM 40 MG PO TBEC: 40.0000 mg | DELAYED_RELEASE_TABLET | Freq: Every day | ORAL | 3 refills | Status: DC

## 2022-02-20 NOTE — Progress Notes (Signed)
Primary Care Physician: Ria Bush, MD  Primary Gastroenterologist:  Dr. Lucilla Lame  Chief Complaint  Patient presents with   Follow-up   Gastroesophageal Reflux    Pt denies any new concerns and reports he is doing well on Pantoprazole daily   Medication Refill    HPI: Micheal Jacobs is a 41 y.o. male here with a history of elevated liver enzymes and GERD. The patient has been found fatty liver on imaging.  The patient had last seen me back in 2022 with a need for refill of his pantoprazole.  The patient was given a refill for a year.  Past Medical History:  Diagnosis Date   Colitis 2000s   ?diverticulitis at age 58 yo   Edema, peripheral 09/24/2012   Gastritis    GERD (gastroesophageal reflux disease)    Heartburn    History of chicken pox    History of kidney stones    Obesity    Right low back pain 12/01/2016   Smoker    Stricture and stenosis of esophagus     Current Outpatient Medications  Medication Sig Dispense Refill   amLODipine (NORVASC) 10 MG tablet Take 1 tablet (10 mg total) by mouth daily. 90 tablet 3   Cranberry-Milk Thistle (LIVER & KIDNEY CLEANSER PO) Take by mouth daily.     Multiple Vitamins-Minerals (CENTRUM MEN PO) Take 1 tablet by mouth daily.     pantoprazole (PROTONIX) 40 MG tablet Take 1 tablet (40 mg total) by mouth daily. 90 tablet 1   No current facility-administered medications for this visit.    Allergies as of 02/20/2022 - Review Complete 12/20/2021  Allergen Reaction Noted   Shellfish allergy Shortness Of Breath, Itching, and Anaphylaxis 07/02/2012   Bentyl [dicyclomine hcl] Palpitations 09/03/2017    ROS:  General: Negative for anorexia, weight loss, fever, chills, fatigue, weakness. ENT: Negative for hoarseness, difficulty swallowing , nasal congestion. CV: Negative for chest pain, angina, palpitations, dyspnea on exertion, peripheral edema.  Respiratory: Negative for dyspnea at rest, dyspnea on exertion, cough,  sputum, wheezing.  GI: See history of present illness. GU:  Negative for dysuria, hematuria, urinary incontinence, urinary frequency, nocturnal urination.  Endo: Negative for unusual weight change.    Physical Examination:   There were no vitals taken for this visit.  General: Well-nourished, well-developed in no acute distress.  Eyes: No icterus. Conjunctivae pink. Lungs: Clear to auscultation bilaterally. Non-labored. Heart: Regular rate and rhythm, no murmurs rubs or gallops.  Abdomen: Bowel sounds are normal, nontender, nondistended, no hepatosplenomegaly or masses, no abdominal bruits or hernia , no rebound or guarding.   Extremities: No lower extremity edema. No clubbing or deformities. Neuro: Alert and oriented x 3.  Grossly intact. Skin: Warm and dry, no jaundice.   Psych: Alert and cooperative, normal mood and affect.  Labs:    Imaging Studies: No results found.  Assessment and Plan:   Micheal Jacobs is a 41 y.o. y/o male with history of reflux.  The patient states that as long as he takes the medication he is feeling well.  There is no report of any unexplained weight loss dysphagia nausea vomiting black stools or bloody stools.  He states when he stops the medication he has significant amount of heartburn.  The patient also reports that he is quit drinking 3 months ago.  He is due for liver enzymes by his primary care provider in February.  The patient has also been told to try and lose weight.  The patient has been explained the plan and agrees with it.     Midge Minium, MD. [name]    Note: This dictation was prepared with Dragon dictation along with smaller phrase technology. Any transcriptional errors that result from this process are unintentional.

## 2022-04-26 DIAGNOSIS — M5416 Radiculopathy, lumbar region: Secondary | ICD-10-CM | POA: Diagnosis not present

## 2022-06-22 ENCOUNTER — Other Ambulatory Visit: Payer: BC Managed Care – PPO

## 2022-07-26 ENCOUNTER — Other Ambulatory Visit (INDEPENDENT_AMBULATORY_CARE_PROVIDER_SITE_OTHER): Payer: BC Managed Care – PPO

## 2022-07-26 DIAGNOSIS — Z8249 Family history of ischemic heart disease and other diseases of the circulatory system: Secondary | ICD-10-CM | POA: Diagnosis not present

## 2022-07-26 DIAGNOSIS — K760 Fatty (change of) liver, not elsewhere classified: Secondary | ICD-10-CM | POA: Diagnosis not present

## 2022-07-26 DIAGNOSIS — E785 Hyperlipidemia, unspecified: Secondary | ICD-10-CM | POA: Diagnosis not present

## 2022-07-26 LAB — COMPREHENSIVE METABOLIC PANEL
ALT: 66 U/L — ABNORMAL HIGH (ref 0–53)
AST: 37 U/L (ref 0–37)
Albumin: 4.3 g/dL (ref 3.5–5.2)
Alkaline Phosphatase: 65 U/L (ref 39–117)
BUN: 13 mg/dL (ref 6–23)
CO2: 27 mEq/L (ref 19–32)
Calcium: 10.3 mg/dL (ref 8.4–10.5)
Chloride: 104 mEq/L (ref 96–112)
Creatinine, Ser: 0.71 mg/dL (ref 0.40–1.50)
GFR: 114.48 mL/min (ref >60.00)
Glucose, Bld: 89 mg/dL (ref 70–99)
Potassium: 4.3 mEq/L (ref 3.5–5.1)
Sodium: 141 mEq/L (ref 135–145)
Total Bilirubin: 0.3 mg/dL (ref 0.2–1.2)
Total Protein: 7.4 g/dL (ref 6.0–8.3)

## 2022-07-26 LAB — LIPID PANEL
Cholesterol: 208 mg/dL — ABNORMAL HIGH (ref 0–200)
HDL: 25.5 mg/dL — ABNORMAL LOW (ref >39.00)
LDL Cholesterol: 149 mg/dL — ABNORMAL HIGH (ref 0–99)
NonHDL: 182.57
Total CHOL/HDL Ratio: 8
Triglycerides: 166 mg/dL — ABNORMAL HIGH (ref 0.0–149.0)
VLDL: 33.2 mg/dL (ref 0.0–40.0)

## 2022-07-27 LAB — APOLIPOPROTEIN B: Apolipoprotein B: 133 mg/dL — ABNORMAL HIGH (ref <90)

## 2022-07-28 LAB — LIPOPROTEIN A (LPA): Lipoprotein (a): 10 nmol/L (ref <75)

## 2022-12-14 ENCOUNTER — Other Ambulatory Visit: Payer: Self-pay | Admitting: Family Medicine

## 2022-12-14 DIAGNOSIS — E785 Hyperlipidemia, unspecified: Secondary | ICD-10-CM

## 2022-12-14 DIAGNOSIS — I10 Essential (primary) hypertension: Secondary | ICD-10-CM

## 2022-12-14 DIAGNOSIS — Z8249 Family history of ischemic heart disease and other diseases of the circulatory system: Secondary | ICD-10-CM

## 2022-12-15 ENCOUNTER — Other Ambulatory Visit (INDEPENDENT_AMBULATORY_CARE_PROVIDER_SITE_OTHER): Payer: BC Managed Care – PPO

## 2022-12-15 DIAGNOSIS — I10 Essential (primary) hypertension: Secondary | ICD-10-CM

## 2022-12-15 DIAGNOSIS — E785 Hyperlipidemia, unspecified: Secondary | ICD-10-CM | POA: Diagnosis not present

## 2022-12-15 LAB — COMPREHENSIVE METABOLIC PANEL
ALT: 62 U/L — ABNORMAL HIGH (ref 0–53)
AST: 36 U/L (ref 0–37)
Albumin: 4.6 g/dL (ref 3.5–5.2)
Alkaline Phosphatase: 59 U/L (ref 39–117)
BUN: 13 mg/dL (ref 6–23)
CO2: 28 mEq/L (ref 19–32)
Calcium: 9.6 mg/dL (ref 8.4–10.5)
Chloride: 104 mEq/L (ref 96–112)
Creatinine, Ser: 0.72 mg/dL (ref 0.40–1.50)
GFR: 113.69 mL/min (ref >60.00)
Glucose, Bld: 88 mg/dL (ref 70–99)
Potassium: 4.3 mEq/L (ref 3.5–5.1)
Sodium: 140 mEq/L (ref 135–145)
Total Bilirubin: 0.4 mg/dL (ref 0.2–1.2)
Total Protein: 7.5 g/dL (ref 6.0–8.3)

## 2022-12-15 LAB — MICROALBUMIN / CREATININE URINE RATIO
Creatinine,U: 213.2 mg/dL
Microalb Creat Ratio: 1 mg/g (ref 0.0–30.0)
Microalb, Ur: 2.2 mg/dL — ABNORMAL HIGH (ref 0.0–1.9)

## 2022-12-15 LAB — LIPID PANEL
Cholesterol: 227 mg/dL — ABNORMAL HIGH (ref 0–200)
HDL: 26 mg/dL — ABNORMAL LOW (ref >39.00)
NonHDL: 201.41
Total CHOL/HDL Ratio: 9
Triglycerides: 212 mg/dL — ABNORMAL HIGH (ref 0.0–149.0)
VLDL: 42.4 mg/dL — ABNORMAL HIGH (ref 0.0–40.0)

## 2022-12-15 LAB — LDL CHOLESTEROL, DIRECT: Direct LDL: 179 mg/dL

## 2022-12-22 ENCOUNTER — Encounter: Payer: BC Managed Care – PPO | Admitting: Family Medicine

## 2023-01-10 ENCOUNTER — Encounter: Payer: Self-pay | Admitting: Family Medicine

## 2023-01-10 ENCOUNTER — Ambulatory Visit: Payer: BC Managed Care – PPO | Admitting: Family Medicine

## 2023-01-10 DIAGNOSIS — I10 Essential (primary) hypertension: Secondary | ICD-10-CM | POA: Diagnosis not present

## 2023-01-10 DIAGNOSIS — Z6841 Body Mass Index (BMI) 40.0 and over, adult: Secondary | ICD-10-CM | POA: Diagnosis not present

## 2023-01-10 MED ORDER — TIRZEPATIDE 5 MG/0.5ML ~~LOC~~ SOAJ: 5.0000 mg | SUBCUTANEOUS | 3 refills | Status: AC

## 2023-01-10 MED ORDER — ZEPBOUND 2.5 MG/0.5ML ~~LOC~~ SOAJ: 2.5000 mg | SUBCUTANEOUS | 0 refills | Status: AC

## 2023-01-10 NOTE — Assessment & Plan Note (Signed)
Stopped amlodipine due to pedal edema (even 5mg  dose).  BP currently stable off medication.

## 2023-01-10 NOTE — Progress Notes (Signed)
Ph: [phone] Fax: [phone]   Patient ID: Micheal Jacobs, male    DOB: May 13, 1981, 42 y.o.   MRN: [id number]  This visit was conducted in person.  BP 138/84   Pulse 96   Temp 98.1 F (36.7 C) (Temporal)   Ht 6\' 1"  (1.854 m)   Wt (!) 324 lb 9.6 oz (147.2 kg)   SpO2 97%   BMI 42.83 kg/m   BP Readings from Last 3 Encounters:  01/10/23 138/84  12/20/21 126/82  04/29/21 (!) 144/110    CC: weight loss Subjective:   HPI: Micheal Jacobs is a 42 y.o. male presenting on 01/10/2023 for Weight Loss (Discuss Tirzepathide or monujaro for weight loss )   Starting weight: 324 lbs Last weight: NA lbs Today's weight 324 lbs  Previous trial phentermine 10 yrs ago with benefit however caused trouble sleeping and tachycardia. Wants to avoid phentermine.   Stopped amlodipine due to worsening pedal edema both at 5mg  and 10mg  dose. BP at home running 130s/80s.   States he stopped drinking 05/2022.   24 hour recall: Breakfast - skips  Snack 8am - [employer] white mocha  Lunch - skipped  Dinner 7:45pm - 2 slices of meatlover pizza from [employer] and 12oz sweet tea   Activity regimen: No regular exercise. Stays active at work.        Relevant past medical, surgical, family and social history reviewed and updated as indicated. Interim medical history since our last visit reviewed. Allergies and medications reviewed and updated. Outpatient Medications Prior to Visit  Medication Sig Dispense Refill   Cranberry-Milk Thistle (LIVER & KIDNEY CLEANSER PO) Take by mouth daily.     Multiple Vitamins-Minerals (CENTRUM MEN PO) Take 1 tablet by mouth daily.     pantoprazole (PROTONIX) 40 MG tablet Take 1 tablet (40 mg total) by mouth daily. 90 tablet 3   amLODipine (NORVASC) 10 MG tablet Take 1 tablet (10 mg total) by mouth daily. (Patient not taking: Reported on 01/10/2023) 90 tablet 3   No facility-administered medications prior to visit.     Per HPI unless specifically  indicated in ROS section below Review of Systems  Objective:  BP 138/84   Pulse 96   Temp 98.1 F (36.7 C) (Temporal)   Ht 6\' 1"  (1.854 m)   Wt (!) 324 lb 9.6 oz (147.2 kg)   SpO2 97%   BMI 42.83 kg/m   Wt Readings from Last 3 Encounters:  01/10/23 (!) 324 lb 9.6 oz (147.2 kg)  12/20/21 (!) 323 lb 8 oz (146.7 kg)  04/29/21 (!) 333 lb 3 oz (151.1 kg)      Physical Exam Vitals and nursing note reviewed.  Constitutional:      Appearance: Normal appearance. He is not ill-appearing.  HENT:     Mouth/Throat:     Mouth: Mucous membranes are moist.     Pharynx: Oropharynx is clear. No oropharyngeal exudate or posterior oropharyngeal erythema.  Eyes:     Extraocular Movements: Extraocular movements intact.     Conjunctiva/sclera: Conjunctivae normal.     Pupils: Pupils are equal, round, and reactive to light.  Cardiovascular:     Rate and Rhythm: Normal rate and regular rhythm.     Pulses: Normal pulses.     Heart sounds: Normal heart sounds. No murmur heard. Pulmonary:     Effort: Pulmonary effort is normal. No respiratory distress.     Breath sounds: Normal breath sounds. No wheezing, rhonchi or rales.  Musculoskeletal:  Cervical back: Normal range of motion and neck supple.     Right lower leg: No edema.     Left lower leg: No edema.  Skin:    General: Skin is warm and dry.     Findings: No rash.  Neurological:     Mental Status: He is alert.  Psychiatric:        Mood and Affect: Mood normal.        Behavior: Behavior normal.       Results for orders placed or performed in visit on 12/15/22  Microalbumin / creatinine urine ratio  Result Value Ref Range   Microalb, Ur 2.2 (H) 0.0 - 1.9 mg/dL   Creatinine,U 147.8 mg/dL   Microalb Creat Ratio 1.0 0.0 - 30.0 mg/g  Comprehensive metabolic panel  Result Value Ref Range   Sodium 140 135 - 145 mEq/L   Potassium 4.3 3.5 - 5.1 mEq/L   Chloride 104 96 - 112 mEq/L   CO2 28 19 - 32 mEq/L   Glucose, Bld 88 70 - 99  mg/dL   BUN 13 6 - 23 mg/dL   Creatinine, Ser 2.95 0.40 - 1.50 mg/dL   Total Bilirubin 0.4 0.2 - 1.2 mg/dL   Alkaline Phosphatase 59 39 - 117 U/L   AST 36 0 - 37 U/L   ALT 62 (H) 0 - 53 U/L   Total Protein 7.5 6.0 - 8.3 g/dL   Albumin 4.6 3.5 - 5.2 g/dL   GFR 621.30 >86.57 mL/min   Calcium 9.6 8.4 - 10.5 mg/dL  Lipid panel  Result Value Ref Range   Cholesterol 227 (H) 0 - 200 mg/dL   Triglycerides 846.9 (H) 0.0 - 149.0 mg/dL   HDL 62.95 (L) >28.41 mg/dL   VLDL 32.4 (H) 0.0 - 40.1 mg/dL   Total CHOL/HDL Ratio 9    NonHDL 201.41   LDL cholesterol, direct  Result Value Ref Range   Direct LDL 179.0 mg/dL   Lab Results  Component Value Date   HGBA1C 5.5 05/19/2019    Assessment & Plan:   Problem List Items Addressed This Visit     Obesity, morbid, BMI 40.0-49.9 (HCC) - Primary    Patient is interested in GIP/GLP1RA Zepbound. Reviewed mechanism of action of medication as well as side effects and adverse events to watch for including nausea, diarrhea, constipation, pancreatitis. No fmhx medullary thyroid cancer or MEN2. Discussed titration schedule for medication. Will start Zepbound 2.5mg  weekly for 1 month then increase to 5mg  weekly. Discussed need for regular visits for weight management to monitor medication effect and tolerance and weight loss. Discussed googling or GoodRx for discount card. He will let me know if trouble filling. RTC 3 mo CPE.       Relevant Medications   tirzepatide (ZEPBOUND) 2.5 MG/0.5ML Pen   tirzepatide Greggory Keen) 5 MG/0.5ML Pen (Start on 02/07/2023)   Primary hypertension    Stopped amlodipine due to pedal edema (even 5mg  dose).  BP currently stable off medication.         Meds ordered this encounter  Medications   tirzepatide (ZEPBOUND) 2.5 MG/0.5ML Pen    Sig: Inject 2.5 mg into the skin once a week.    Dispense:  2 mL    Refill:  0   tirzepatide (MOUNJARO) 5 MG/0.5ML Pen    Sig: Inject 5 mg into the skin once a week.    Dispense:  2 mL     Refill:  3    No orders of the  defined types were placed in this encounter.   Patient Instructions  Price out Zepbound (Tirzepatide) - start at 2.5mg  weekly for the first month then increase to 5mg  weekly.  May increase each month - 5mg , 7.5mg , 10mg .  Watch for side effects - nausea, constipation, diarrhea. If upper abdominal pain, stop medicine and let us know.  Return in 3 months for physical  Follow up plan: Return in about 3 months (around 04/12/2023), or if symptoms worsen or fail to improve.  Eustaquio Boyden, MD

## 2023-01-10 NOTE — Patient Instructions (Addendum)
Price out Zepbound (Tirzepatide) - start at 2.5mg  weekly for the first month then increase to 5mg  weekly.  May increase each month - 5mg , 7.5mg , 10mg .  Watch for side effects - nausea, constipation, diarrhea. If upper abdominal pain, stop medicine and let us know.  Return in 3 months for physical

## 2023-01-10 NOTE — Assessment & Plan Note (Signed)
Patient is interested in GIP/GLP1RA Zepbound. Reviewed mechanism of action of medication as well as side effects and adverse events to watch for including nausea, diarrhea, constipation, pancreatitis. No fmhx medullary thyroid cancer or MEN2. Discussed titration schedule for medication. Will start Zepbound 2.5mg  weekly for 1 month then increase to 5mg  weekly. Discussed need for regular visits for weight management to monitor medication effect and tolerance and weight loss. Discussed googling or GoodRx for discount card. He will let me know if trouble filling. RTC 3 mo CPE.

## 2023-02-22 ENCOUNTER — Other Ambulatory Visit: Payer: Self-pay | Admitting: Family Medicine

## 2023-03-15 ENCOUNTER — Other Ambulatory Visit: Payer: Self-pay | Admitting: Gastroenterology

## 2023-04-10 ENCOUNTER — Other Ambulatory Visit: Payer: Self-pay | Admitting: Gastroenterology

## 2023-04-11 ENCOUNTER — Telehealth: Payer: Self-pay | Admitting: Gastroenterology

## 2023-04-11 NOTE — Telephone Encounter (Signed)
The patient called in because he needs a refill of (Protonix) and the pharmacy said he don't have a 3 months supply.

## 2023-04-14 ENCOUNTER — Other Ambulatory Visit: Payer: Self-pay | Admitting: Gastroenterology

## 2023-04-17 ENCOUNTER — Encounter: Payer: BC Managed Care – PPO | Admitting: Family Medicine

## 2023-04-26 ENCOUNTER — Telehealth: Payer: Self-pay | Admitting: Gastroenterology

## 2023-04-26 ENCOUNTER — Other Ambulatory Visit: Payer: Self-pay | Admitting: Gastroenterology

## 2023-04-26 NOTE — Telephone Encounter (Signed)
Patient called in to get a refill on (Protonix) 40 MG. Patient states that he is completely out of medication. [facility] on [address] [place], [place] [address]

## 2023-04-30 NOTE — Telephone Encounter (Signed)
Rx sent through e-scribe  

## 2023-06-27 ENCOUNTER — Other Ambulatory Visit: Payer: Self-pay | Admitting: Gastroenterology

## 2023-07-02 ENCOUNTER — Encounter: Payer: BC Managed Care – PPO | Admitting: Family Medicine

## 2023-07-28 ENCOUNTER — Other Ambulatory Visit: Payer: Self-pay | Admitting: Gastroenterology

## 2023-07-30 ENCOUNTER — Telehealth: Payer: Self-pay

## 2023-07-30 MED ORDER — PANTOPRAZOLE SODIUM 40 MG PO TBEC: 40.0000 mg | DELAYED_RELEASE_TABLET | Freq: Every day | ORAL | 0 refills | Status: DC

## 2023-07-30 NOTE — Telephone Encounter (Signed)
 Patient left a voicemail stating he needed a refill on his pantoprazole. Called patient back and informed patient that he needed a appointment before it could be filled. Patient made one for 08/02/2023. Refilled for 30 days

## 2023-08-02 ENCOUNTER — Encounter: Payer: Self-pay | Admitting: Physician Assistant

## 2023-08-02 ENCOUNTER — Ambulatory Visit: Admitting: Physician Assistant

## 2023-08-02 VITALS — BP 150/104 | HR 99 | Temp 98.3°F | Ht 73.0 in | Wt 314.0 lb

## 2023-08-02 DIAGNOSIS — K2970 Gastritis, unspecified, without bleeding: Secondary | ICD-10-CM | POA: Diagnosis not present

## 2023-08-02 DIAGNOSIS — K219 Gastro-esophageal reflux disease without esophagitis: Secondary | ICD-10-CM

## 2023-08-02 DIAGNOSIS — K760 Fatty (change of) liver, not elsewhere classified: Secondary | ICD-10-CM

## 2023-08-02 DIAGNOSIS — Z8719 Personal history of other diseases of the digestive system: Secondary | ICD-10-CM

## 2023-08-02 DIAGNOSIS — R7989 Other specified abnormal findings of blood chemistry: Secondary | ICD-10-CM

## 2023-08-02 DIAGNOSIS — K2930 Chronic superficial gastritis without bleeding: Secondary | ICD-10-CM

## 2023-08-02 MED ORDER — PANTOPRAZOLE SODIUM 20 MG PO TBEC: 20.0000 mg | DELAYED_RELEASE_TABLET | Freq: Every day | ORAL | 3 refills | Status: AC

## 2023-08-02 NOTE — Progress Notes (Signed)
 Celso Amy, PA-C [address]  [address]  [place], [place] [address]  Main: [phone]  Fax: [phone]   Primary Care Physician: Eustaquio Boyden, MD  Primary Gastroenterologist:  Celso Amy, PA-C / Dr. Midge Minium    CC: Follow-up GERD and elevated LFTs  HPI: Micheal Jacobs is a 43 y.o. male, established patient of Dr. Servando Snare, returns for follow-up of GERD and elevated LFTs.  Last saw Dr. Servando Snare 02/2022.  Currently on pantoprazole 40 Mg once daily with good control of acid reflux.  He is requesting medication refill.  If he misses taking pantoprazole for 5 days, then he notices recurrent acid reflux.  He would like to continue this medication.  He denies dysphagia, abdominal pain, or any other GI symptoms.  Last labs 12/15/2022: Mild elevated ALT 62.  All other LFTs normal.  Elevated liver transaminases were attributed to fatty liver disease, and have improved since 2019.  He has greatly decreased alcohol and rarely drinks alcohol in the past few years.  He is also working to lose weight.  Has taken Mounjaro in the past year.  Last RUQ ultrasound 08/2017: hepatic steatosis.  Normal gallbladder.  No liver lesions.  11/2015 EGD: Benign distal esophageal stricture dilated to 18mm.  Mild chronic gastritis, Normal duodenum.  Biopsies negative for H. Pylori.  Current Outpatient Medications  Medication Sig Dispense Refill   Cranberry-Milk Thistle (LIVER & KIDNEY CLEANSER PO) Take by mouth daily.     Multiple Vitamins-Minerals (CENTRUM MEN PO) Take 1 tablet by mouth daily.     pantoprazole (PROTONIX) 40 MG tablet Take 1 tablet (40 mg total) by mouth daily. MUST SCHEDULE OFFICE VISIT 30 tablet 0   tirzepatide (MOUNJARO) 5 MG/0.5ML Pen Inject 5 mg into the skin once a week. 2 mL 3   tirzepatide (ZEPBOUND) 2.5 MG/0.5ML Pen Inject 2.5 mg into the skin once a week. 2 mL 0   No current facility-administered medications for this visit.    Allergies as of 08/02/2023 - Review  Complete 08/02/2023  Allergen Reaction Noted   Shellfish allergy Shortness Of Breath, Itching, and Anaphylaxis 07/02/2012   Bentyl [dicyclomine hcl] Palpitations 09/03/2017    Past Medical History:  Diagnosis Date   Colitis 2000s   ?diverticulitis at age 55 yo   Edema, peripheral 09/24/2012   Gastritis    GERD (gastroesophageal reflux disease)    Heartburn    History of chicken pox    History of kidney stones    Obesity    Right low back pain 12/01/2016   Smoker    Stricture and stenosis of esophagus     Past Surgical History:  Procedure Laterality Date   ELBOW FRACTURE SURGERY  1990s   ESOPHAGOGASTRODUODENOSCOPY (EGD) WITH PROPOFOL N/A 11/25/2015   Procedure: ESOPHAGOGASTRODUODENOSCOPY (EGD) WITH PROPOFOL with dialation;  Midge Minium, MD; gastritis; benign biopsy   TONSILLECTOMY  1986   WISDOM TOOTH EXTRACTION      Review of Systems:    All systems reviewed and negative except where noted in HPI.   Physical Examination:   BP (!) 150/104   Pulse 99   Temp 98.3 F (36.8 C)   Ht 6\' 1"  (1.854 m)   Wt (!) 314 lb (142.4 kg)   BMI 41.43 kg/m   General: Well-nourished, well-developed in no acute distress.  Neuro: Alert and oriented x 3.  Grossly intact.  Psych: Alert and cooperative, normal mood and affect.   Imaging Studies: No results found.  Assessment and Plan:  Micheal Jacobs is a 43 y.o. y/o male returns for f/u of:  1.  GERD / Gastritis - Well controlled on PPI.  Decrease pantoprazole to 20 Mg once daily.  Recommend lowest effective PPI dose necessary.  Okay to take OTC antacid or Pepcid H2 RB as needed.  Recommend Lifestyle Modifications to prevent Acid Reflux.  Rec. Avoid coffee, sodas, peppermint, garlic, onions, alcohol, citrus fruits, chocolate, tomatoes, fatty and spicey foods.  Avoid eating 2-3 hours before bedtime.    2.  History of Esophageal Stricture Dilated in 2017; No recurrent dysphagia since 2017.  Currently asymptomatic.  3.  Elevated  LFTs; mild elevation, stable and improved; attributed to fatty liver.  He will continue to have annual labs monitored through his PCP.  He has labs scheduled for 08/2023.  4.  Hepatic steatosis  Recommend a low-fat diet, regular exercise, and weight loss.  Patient education handout about fatty liver disease was given and discussed from up-to-date.   5.  Colon Cancer Screening  First Screening Colonoscopy will be due at age 3.  Celso Amy, PA-C  Follow up in 1 year or as needed based on GI symptoms.

## 2023-08-24 ENCOUNTER — Encounter: Payer: BC Managed Care – PPO | Admitting: Family Medicine

## 2023-11-12 ENCOUNTER — Encounter: Admitting: Family Medicine

## 2024-01-23 ENCOUNTER — Encounter: Admitting: Family Medicine

## 2024-02-26 ENCOUNTER — Encounter: Admitting: Family Medicine

## 2024-03-10 ENCOUNTER — Ambulatory Visit: Admitting: Family Medicine

## 2024-03-26 ENCOUNTER — Encounter: Admitting: Family Medicine

## 2024-03-26 ENCOUNTER — Ambulatory Visit: Admitting: Family Medicine

## 2024-05-02 ENCOUNTER — Encounter: Admitting: Family Medicine

## 2024-06-25 ENCOUNTER — Encounter: Admitting: Family Medicine

## 2024-08-20 ENCOUNTER — Encounter: Admitting: Family Medicine
